# Patient Record
Sex: Female | Born: 1970
Health system: Southern US, Community
[De-identification: ages and names within clinical notes are randomized; demographics above are authoritative.]

## PROBLEM LIST (undated history)

## (undated) DIAGNOSIS — F329 Major depressive disorder, single episode, unspecified: Secondary | ICD-10-CM

## (undated) DIAGNOSIS — R5383 Other fatigue: Secondary | ICD-10-CM

## (undated) DIAGNOSIS — T7840XA Allergy, unspecified, initial encounter: Secondary | ICD-10-CM

## (undated) DIAGNOSIS — M545 Low back pain: Secondary | ICD-10-CM

## (undated) DIAGNOSIS — F32A Depression, unspecified: Secondary | ICD-10-CM

## (undated) DIAGNOSIS — F419 Anxiety disorder, unspecified: Secondary | ICD-10-CM

## (undated) DIAGNOSIS — G4700 Insomnia, unspecified: Secondary | ICD-10-CM

## (undated) DIAGNOSIS — E8881 Metabolic syndrome: Secondary | ICD-10-CM

## (undated) DIAGNOSIS — F411 Generalized anxiety disorder: Secondary | ICD-10-CM

## (undated) HISTORY — DX: Generalized anxiety disorder: F41.1

## (undated) HISTORY — DX: Morbid (severe) obesity due to excess calories: E66.01

## (undated) HISTORY — PX: BACK SURGERY: SHX140

## (undated) HISTORY — DX: Allergy, unspecified, initial encounter: T78.40XA

## (undated) HISTORY — DX: Depression, unspecified: F32.A

## (undated) HISTORY — DX: Other fatigue: R53.83

## (undated) HISTORY — DX: Major depressive disorder, single episode, unspecified: F32.9

## (undated) HISTORY — DX: Insomnia, unspecified: G47.00

## (undated) HISTORY — DX: Low back pain: M54.5

## (undated) HISTORY — PX: PILONIDAL CYST EXCISION: SHX744

## (undated) HISTORY — DX: Metabolic syndrome: E88.81

## (undated) HISTORY — PX: OVARIAN CYST REMOVAL: SHX89

## (undated) HISTORY — DX: Anxiety disorder, unspecified: F41.9

---

## 2000-05-19 ENCOUNTER — Other Ambulatory Visit: Admission: RE | Admit: 2000-05-19 | Discharge: 2000-05-19 | Payer: Self-pay | Admitting: *Deleted

## 2001-06-16 ENCOUNTER — Other Ambulatory Visit: Admission: RE | Admit: 2001-06-16 | Discharge: 2001-06-16 | Payer: Self-pay | Admitting: Gynecology

## 2002-06-22 ENCOUNTER — Other Ambulatory Visit: Admission: RE | Admit: 2002-06-22 | Discharge: 2002-06-22 | Payer: Self-pay | Admitting: Gynecology

## 2002-12-20 ENCOUNTER — Other Ambulatory Visit: Admission: RE | Admit: 2002-12-20 | Discharge: 2002-12-20 | Payer: Self-pay | Admitting: *Deleted

## 2003-07-20 ENCOUNTER — Other Ambulatory Visit: Admission: RE | Admit: 2003-07-20 | Discharge: 2003-07-20 | Payer: Self-pay | Admitting: *Deleted

## 2003-09-28 ENCOUNTER — Emergency Department (HOSPITAL_COMMUNITY): Admission: EM | Admit: 2003-09-28 | Discharge: 2003-09-28 | Payer: Self-pay

## 2003-12-21 ENCOUNTER — Other Ambulatory Visit: Admission: RE | Admit: 2003-12-21 | Discharge: 2003-12-21 | Payer: Self-pay | Admitting: Obstetrics and Gynecology

## 2004-10-29 ENCOUNTER — Encounter: Admission: RE | Admit: 2004-10-29 | Discharge: 2004-11-23 | Payer: Self-pay | Admitting: Family Medicine

## 2004-11-26 ENCOUNTER — Ambulatory Visit (HOSPITAL_COMMUNITY): Admission: RE | Admit: 2004-11-26 | Discharge: 2004-11-27 | Payer: Self-pay | Admitting: Neurosurgery

## 2005-01-30 ENCOUNTER — Ambulatory Visit (HOSPITAL_COMMUNITY): Admission: RE | Admit: 2005-01-30 | Discharge: 2005-01-31 | Payer: Self-pay | Admitting: Neurosurgery

## 2005-06-25 ENCOUNTER — Other Ambulatory Visit: Admission: RE | Admit: 2005-06-25 | Discharge: 2005-06-25 | Payer: Self-pay | Admitting: Obstetrics and Gynecology

## 2008-12-29 ENCOUNTER — Encounter: Admission: RE | Admit: 2008-12-29 | Discharge: 2008-12-29 | Payer: Self-pay | Admitting: Family Medicine

## 2010-10-23 DIAGNOSIS — M545 Low back pain, unspecified: Secondary | ICD-10-CM | POA: Insufficient documentation

## 2010-10-23 DIAGNOSIS — J309 Allergic rhinitis, unspecified: Secondary | ICD-10-CM | POA: Insufficient documentation

## 2010-10-23 HISTORY — DX: Low back pain, unspecified: M54.50

## 2010-10-25 DIAGNOSIS — F411 Generalized anxiety disorder: Secondary | ICD-10-CM | POA: Insufficient documentation

## 2010-10-25 HISTORY — DX: Generalized anxiety disorder: F41.1

## 2010-11-23 NOTE — Op Note (Signed)
Victoria Cisneros, Victoria Cisneros               ACCOUNT NO.:  [id number]   MEDICAL RECORD NO.:  [id number]          PATIENT TYPE:  OIB   LOCATION:  2899                         FACILITY:  [facility]   PHYSICIAN:  Victoria Cisneros, M.D.DATE OF BIRTH:  February 08, 1971   DATE OF PROCEDURE:  11/26/2004  DATE OF DISCHARGE:                                 OPERATIVE REPORT   PREOPERATIVE DIAGNOSES:  Left L4-L5 disk herniation, lumbar degenerative  disk disease, lumbar spondylosis, lumbar radiculopathy.   POSTOPERATIVE DIAGNOSES:  Left L4-L5 disk herniation, lumbar degenerative  disk disease, lumbar spondylosis, lumbar radiculopathy.   PROCEDURE:  Left L4-L5 lumbar laminotomy, microdiskectomy with  microdissection.   SURGEON:  Victoria Cisneros, M.D.   ASSISTANT:  Victoria Cisneros. Victoria Cisneros, M.D.   ANESTHESIA:  General endotracheal.   INDICATIONS:  The patient is a 40 year old woman who presented with left  lumbar radiculopathy and was found to have significant degenerative changes  with superimposed disk herniations to the right at L3-L4 and to the left at  L4-L5.  Because her symptoms were to the left side, we felt that the L4-L5  level was symptomatic and decided to proceed with elective laminotomy and  microdiskectomy.   DESCRIPTION OF PROCEDURE:  The patient was brought to the operating room and  placed under general endotracheal anesthesia.  The patient was turned to a  prone position.  The lumbar region was prepped with Betadine soap and  solution and draped in a sterile fashion.  The midline was infiltrated with  local anesthetic with epinephrine.  An x-ray was taken and the L4-L5 level  identified.  A midline longitudinal was made over the L4-L5 level and  carried down through the subcutaneous tissue.  Bipolar cautery and  electrocautery were used to maintain hemostasis. Dissection was carried down  to the lumbar fascia which was incised on the left side of the midline, and  the paraspinal muscles were  dissected down to the spinous process and lamina  in a subperiosteal fashion.  The L4-L5 interlaminar space was identified.  An x-ray was taken to confirm the localization, and then a laminotomy was  performed using the X-Max drill and Kerrison punches.  The microscope was  draped and brought into the field and provided magnification, illumination,  and visualization, and the remainder of the decompression was performed  using the microdissection and microsurgical technique.  We carefully  resected the ligamentum flavum and expose the thecal sac and exiting left L5  nerve root.  These structures were gently retracted medially exposing the  left L4-L5 disk herniation with the fragment protruding through the annulus  compressing the thecal sac and nerve root and extending somewhat caudally  behind the body of L5.  This fragment was removed, and then we entered into  the disk space and proceeded with a thorough diskectomy removing all loose  fragment of disk material from both the disk space and the epidural space  and achieving good decompression of the thecal sac and nerve root.  Once the  diskectomy was completed and all loose fragments of disk material were  removed, we established  hemostasis with the use of bipolar cautery, and then  we irrigated the wound successfully with bacitracin solution, checked for  hemostasis once again, and then instilled 2 cc of fentanyl and 80 mg of Depo-  Medrol into the epidural space and proceeded with closure.  The deep fascia  was closed with interrupted undyed 1 Vicryl suture.  The deep subcutaneous  layer was closed with interrupted inverted undyed 1 Vicryl suture.  The  subcutaneous and subcuticular were closed with interrupted inverted 2-0  undyed Vicryl sutures, and the skin was reapproximated with Dermabond.  The  procedure was tolerated well.  The estimated blood loss was less than 25 cc.  Sponge and needle count were correct.  Following surgery,  the wound was  dressed with Adaptic and sterile gauze.  The procedure was tolerated well.  Following surgery, she was to be turned back to the supine position, to be  reversed from anesthetic, extubated, and transferred to the recovery room  for further care.      RWN/MEDQ  D:  11/26/2004  T:  11/26/2004  Job:  [id number]

## 2010-11-23 NOTE — Op Note (Signed)
NAMECHERYN, Victoria Cisneros               ACCOUNT NO.:  [id number]   MEDICAL RECORD NO.:  [id number]          PATIENT TYPE:  INP   LOCATION:  2899                         FACILITY:  [facility]   PHYSICIAN:  Hewitt Shorts, M.D.DATE OF BIRTH:  February 11, 1971   DATE OF PROCEDURE:  01/30/2005  DATE OF DISCHARGE:                                 OPERATIVE REPORT   PREOP DIAGNOSIS:  Recurrent left L4-5 lumbar disk herniation, lumbar  degenerative disease, lumbar spondylosis and lumbar radiculopathy.   POSTOPERATIVE DIAGNOSIS:  Recurrent left L4-5 lumbar disk herniation, lumbar  degenerative disease, lumbar spondylosis and lumbar radiculopathy.   PROCEDURE:  Left L4-5 lumbar laminotomy, microdiskectomy with  microdissection.   SURGEON:  Hewitt Shorts, M.D.   ASSISTANT.:  Danae Orleans. Venetia Maxon, M.D.   ANESTHESIA:  General endotracheal.   INDICATIONS FOR PROCEDURE:  The patient is a 40 year old woman who was about  2-1/2 months status post a left L4-5 lumbar laminotomy and microdiskectomy.  She did well but developed recurrent radicular pain. MRI scan revealed a  large recurrent disk herniation.  A decision was made to proceed with  elective laminotomy, microdiskectomy.   PROCEDURE:  The patient was brought to the operating room and placed under  general endotracheal anesthesia.  The patient was turned to prone position,  lumbar region was prepped with Betadine soap solution, draped in sterile  fashion. The previous midline incision was reopened after an x-ray taken to  confirm localization. Dissection was carried down through subcutaneous  tissue. Bipolar cautery and electrocautery used to maintain hemostasis.  Dissection was carried down to the lumbar fascia which was incised on the  left side of the midline. The paraspinal muscles were resected from the  spinous process and lamina in subperiosteal fashion. The previous laminotomy  was identified and x-rays taken to confirm the localization and  using  curettes, we were able to remove the scar tissue. The microscope was draped  and brought the field to provide additional magnification, illumination and  visualization and remainder of the decompression was performed using  microdissection and microsurgical technique.  We dissected in the epidural  space and encountered the disk herniation. We identified the disk space and  gently retracted the thecal sac and nerve root medially.  We were able to  more fully mobilize the herniated fragment and were able to remove a large  recurrent disk fragment that had herniated free within the epidural space.  The thecal sac and nerve root were decompressed. We entered into the disk  space and using a variety of pituitary rongeurs, completed the diskectomy  removing all loose fragments of disk material from both the disk space and  epidural space.  The wound was irrigated bacitracin solution, checked for  hemostasis which was established with use of bipolar cautery as well as  Gelfoam soaked in Thrombin. We then instilled to 2 cc of fentanyl and 80  milligrams Depo-Medrol into the epidural space and proceeded with closure.  The deep fascia closed with interrupted, undyed one Vicryl sutures. The deep  subcutaneous layer was closed interrupted undyed one Vicryl sutures. The  subcutaneous and subcuticular layer closed with interrupted inverted 2-0 and  3-0 Vicryl sutures and skin edges were approximated with Dermabond. The  wound was dressed with Adaptic and sterile gauze.  The procedure was  tolerated well.  The estimated blood loss was less than 25 cc. Sponge,  needle count were correct. Following surgery the patient was turned back to  supine position to be reversed anesthetic, extubated and transferred to the  recovery room for further care.       RWN/MEDQ  D:  01/30/2005  T:  01/30/2005  Job:  [id number]

## 2011-07-17 ENCOUNTER — Other Ambulatory Visit: Payer: Self-pay | Admitting: Obstetrics and Gynecology

## 2011-07-17 DIAGNOSIS — N6310 Unspecified lump in the right breast, unspecified quadrant: Secondary | ICD-10-CM

## 2011-07-24 ENCOUNTER — Ambulatory Visit
Admission: RE | Admit: 2011-07-24 | Discharge: 2011-07-24 | Disposition: A | Discharge status: Home / Self Care | Payer: BC Managed Care – PPO | Source: Ambulatory Visit | Attending: Obstetrics and Gynecology | Admitting: Obstetrics and Gynecology

## 2011-07-24 DIAGNOSIS — N6310 Unspecified lump in the right breast, unspecified quadrant: Secondary | ICD-10-CM

## 2012-01-29 DIAGNOSIS — F430 Acute stress reaction: Secondary | ICD-10-CM | POA: Insufficient documentation

## 2012-01-29 DIAGNOSIS — F329 Major depressive disorder, single episode, unspecified: Secondary | ICD-10-CM | POA: Insufficient documentation

## 2012-01-29 DIAGNOSIS — F410 Panic disorder [episodic paroxysmal anxiety] without agoraphobia: Secondary | ICD-10-CM | POA: Insufficient documentation

## 2012-01-29 DIAGNOSIS — R5383 Other fatigue: Secondary | ICD-10-CM | POA: Insufficient documentation

## 2012-01-29 DIAGNOSIS — F32A Depression, unspecified: Secondary | ICD-10-CM | POA: Insufficient documentation

## 2012-01-29 DIAGNOSIS — G4700 Insomnia, unspecified: Secondary | ICD-10-CM | POA: Insufficient documentation

## 2012-01-29 HISTORY — DX: Depression, unspecified: F32.A

## 2012-01-29 HISTORY — DX: Insomnia, unspecified: G47.00

## 2012-01-29 HISTORY — DX: Other fatigue: R53.83

## 2012-02-04 DIAGNOSIS — E559 Vitamin D deficiency, unspecified: Secondary | ICD-10-CM | POA: Insufficient documentation

## 2012-10-31 ENCOUNTER — Emergency Department (HOSPITAL_BASED_OUTPATIENT_CLINIC_OR_DEPARTMENT_OTHER): Payer: No Typology Code available for payment source

## 2012-10-31 ENCOUNTER — Encounter (HOSPITAL_BASED_OUTPATIENT_CLINIC_OR_DEPARTMENT_OTHER): Payer: Self-pay | Admitting: *Deleted

## 2012-10-31 DIAGNOSIS — Y929 Unspecified place or not applicable: Secondary | ICD-10-CM | POA: Insufficient documentation

## 2012-10-31 DIAGNOSIS — S838X9A Sprain of other specified parts of unspecified knee, initial encounter: Secondary | ICD-10-CM | POA: Insufficient documentation

## 2012-10-31 DIAGNOSIS — IMO0001 Reserved for inherently not codable concepts without codable children: Secondary | ICD-10-CM | POA: Insufficient documentation

## 2012-10-31 DIAGNOSIS — X500XXA Overexertion from strenuous movement or load, initial encounter: Secondary | ICD-10-CM | POA: Insufficient documentation

## 2012-10-31 DIAGNOSIS — Y9302 Activity, running: Secondary | ICD-10-CM | POA: Insufficient documentation

## 2012-10-31 DIAGNOSIS — Y9364 Activity, baseball: Secondary | ICD-10-CM | POA: Insufficient documentation

## 2012-10-31 DIAGNOSIS — S86819A Strain of other muscle(s) and tendon(s) at lower leg level, unspecified leg, initial encounter: Secondary | ICD-10-CM | POA: Insufficient documentation

## 2012-10-31 NOTE — ED Notes (Signed)
Pt states she was playing softball earlier this p.m. And stepped in a hole. Heard something pop. Unable to bear wt. PMS intact. C/O pain to calf area.

## 2012-11-01 ENCOUNTER — Emergency Department (HOSPITAL_BASED_OUTPATIENT_CLINIC_OR_DEPARTMENT_OTHER)
Admission: EM | Admit: 2012-11-01 | Discharge: 2012-11-01 | Disposition: A | Discharge status: Home / Self Care | Payer: No Typology Code available for payment source | Attending: Emergency Medicine | Admitting: Emergency Medicine

## 2012-11-01 DIAGNOSIS — S86112A Strain of other muscle(s) and tendon(s) of posterior muscle group at lower leg level, left leg, initial encounter: Secondary | ICD-10-CM

## 2012-11-01 MED ORDER — IBUPROFEN 800 MG PO TABS: 800.0000 mg | ORAL_TABLET | Freq: Three times a day (TID) | ORAL | Status: DC

## 2012-11-01 MED ORDER — OXYCODONE-ACETAMINOPHEN 5-325 MG PO TABS
2.0000 | ORAL_TABLET | Freq: Once | ORAL | Status: AC
  Administered 2012-11-01: 2 via ORAL
  Filled 2012-11-01 (×2): qty 2

## 2012-11-01 MED ORDER — OXYCODONE-ACETAMINOPHEN 5-325 MG PO TABS: 2.0000 | ORAL_TABLET | ORAL | Status: DC | PRN

## 2012-11-01 NOTE — ED Provider Notes (Signed)
History     CSN: [id number]  Arrival date & time 10/31/12  2125   First MD Initiated Contact with Patient 11/01/12 0015      Chief Complaint  Patient presents with  . Leg Injury    (Consider location/radiation/quality/duration/timing/severity/associated sxs/prior treatment) HPI Comments: Patient was playing softball 6 hours ago when running and feels that she stepped on a rut. She had immediate pain in the back of her left calf and heard a pop. She's been unable to bear weight since. She denies falling. She denies any weakness, numbness or tingling. She has no open wounds. She has no pain with range of motion of her knee or ankle.  The history is provided by the patient.    History reviewed. No pertinent past medical history.  Past Surgical History  Procedure Laterality Date  . Back surgery      History reviewed. No pertinent family history.  History  Substance Use Topics  . Smoking status: Never Smoker   . Smokeless tobacco: Not on file  . Alcohol Use: No    OB History   Grav Para Term Preterm Abortions TAB SAB Ect Mult Living                  Review of Systems  Constitutional: Negative for activity change and appetite change.  HENT: Negative for congestion and rhinorrhea.   Respiratory: Negative for cough, chest tightness and shortness of breath.   Cardiovascular: Negative for chest pain.  Gastrointestinal: Negative for nausea, vomiting and abdominal pain.  Genitourinary: Negative for dysuria and hematuria.  Musculoskeletal: Positive for myalgias and arthralgias.  Skin: Negative for rash.  Neurological: Negative for dizziness, weakness and headaches.  A complete 10 system review of systems was obtained and all systems are negative except as noted in the HPI and PMH.    Allergies  Sulfa antibiotics  Home Medications  No current outpatient prescriptions on file.  BP 134/86  Pulse 104  Temp(Src) 98.4 F (36.9 C) (Oral)  Resp 20  Ht 5\' 7"  (1.702 m)  Wt  247 lb (112.038 kg)  BMI 38.68 kg/m2  SpO2 97%  LMP 10/05/2012  Physical Exam  Constitutional: She is oriented to person, place, and time. She appears well-developed and well-nourished. No distress.  HENT:  Head: Normocephalic and atraumatic.  Mouth/Throat: Oropharynx is clear and moist. No oropharyngeal exudate.  Eyes: Conjunctivae and EOM are normal. Pupils are equal, round, and reactive to light.  Neck: Normal range of motion. Neck supple.  Cardiovascular: Normal rate, regular rhythm and normal heart sounds.   No murmur heard. Pulmonary/Chest: Effort normal and breath sounds normal. No respiratory distress.  Abdominal: Soft. There is no tenderness. There is no rebound and no guarding.  Musculoskeletal: Normal range of motion. She exhibits tenderness. She exhibits no edema.  Left calf tenderness to palpation on medial gastroc head. Full range of motion of left knee and ankle. Intact DP and PT pulses.  Achilles tendon intact with intact ankle plantar and dorsiflexion  Neurological: She is alert and oriented to person, place, and time. No cranial nerve deficit. She exhibits normal muscle tone. Coordination normal.  Skin: Skin is warm.    ED Course  Procedures (including critical care time)  Labs Reviewed - No data to display Dg Tibia/fibula Left  10/31/2012  *RADIOLOGY REPORT*  Clinical Data: Pain  LEFT TIBIA AND FIBULA - 2 VIEW  Comparison: None.  Findings: No fracture or dislocation.  No soft tissue abnormality. No radiopaque foreign body.  IMPRESSION: No fracture of the left tibia or fibula identified.   Original Report Authenticated By: Christiana Pellant, M.D.      No diagnosis found.    MDM  Left calf pain onset while running. No weakness, numbness, tingling.  Intact DP and PT pulses.  Achilles tendon intact. Suspect gastroc strain or tear.  Analgesics, cam walker, crutches, ortho followup.        Glynn Octave, MD 11/01/12 0200

## 2012-11-01 NOTE — ED Notes (Signed)
rx x 2 for ibuprofen and percocet- pt has a ride

## 2012-11-03 ENCOUNTER — Ambulatory Visit (INDEPENDENT_AMBULATORY_CARE_PROVIDER_SITE_OTHER): Payer: No Typology Code available for payment source | Admitting: Family Medicine

## 2012-11-03 ENCOUNTER — Encounter: Payer: Self-pay | Admitting: Family Medicine

## 2012-11-03 VITALS — BP 119/81 | HR 91 | Ht 67.0 in | Wt 247.0 lb

## 2012-11-03 DIAGNOSIS — S838X9A Sprain of other specified parts of unspecified knee, initial encounter: Secondary | ICD-10-CM

## 2012-11-03 DIAGNOSIS — S86112A Strain of other muscle(s) and tendon(s) of posterior muscle group at lower leg level, left leg, initial encounter: Secondary | ICD-10-CM

## 2012-11-03 NOTE — Patient Instructions (Addendum)
You have a calf strain/partial tear (tennis leg) Compression sleeve or ace wrap to help with swelling and pain. Icing for 15 minutes at a time 3-4 times a day Heel lifts in cam walker for next 1-2 weeks - can switch to heel lift in a supportive shoe when you feel comfortable. Crutches as needed Tylenol and/or ibuprofen for pain. Start ankle range of motion exercises (up/down and alphabet exercises) 2-3 times a day. Follow up with me in 2 weeks for reevaluation.

## 2012-11-04 ENCOUNTER — Encounter: Payer: Self-pay | Admitting: Family Medicine

## 2012-11-04 DIAGNOSIS — S86119A Strain of other muscle(s) and tendon(s) of posterior muscle group at lower leg level, unspecified leg, initial encounter: Secondary | ICD-10-CM | POA: Insufficient documentation

## 2012-11-04 NOTE — Assessment & Plan Note (Signed)
start with ACE wrap, icing, heel lifts in cam walker.  Start ROM exercises multiple times a day.  Continue using crutches but weight bear as tolerated.  Tylenol, nsaids as needed.  F/u in 2 weeks for reevaluation.

## 2012-11-04 NOTE — Progress Notes (Signed)
  Subjective:    Patient ID: Victoria Cisneros, female    DOB: 1971/06/24, 42 y.o.   MRN: [id number]  PCP: Dr. Asencion Partridge  HPI 42 yo F here for left calf injury.  Patient reports she was playing in a church softball league on 4/26. Was running between first and second base when she felt a pop in left calf medially. Some swelling but no bruising. Went to ED as she couldn't put weight on leg. Was placed in a cam walker with crutches and referred to [place]. Taking ibuprofen. No prior calf injuries.  History reviewed. No pertinent past medical history.  Current Outpatient Prescriptions on File Prior to Visit  Medication Sig Dispense Refill  . ibuprofen (ADVIL,MOTRIN) 800 MG tablet Take 1 tablet (800 mg total) by mouth 3 (three) times daily.  21 tablet  0  . oxyCODONE-acetaminophen (PERCOCET/ROXICET) 5-325 MG per tablet Take 2 tablets by mouth every 4 (four) hours as needed for pain.  15 tablet  0   No current facility-administered medications on file prior to visit.    Past Surgical History  Procedure Laterality Date  . Back surgery      Allergies  Allergen Reactions  . Sulfa Antibiotics Anaphylaxis    History   Social History  . Marital Status: Married    Spouse Name: N/A    Number of Children: N/A  . Years of Education: N/A   Occupational History  . Not on file.   Social History Main Topics  . Smoking status: Never Smoker   . Smokeless tobacco: Not on file  . Alcohol Use: No  . Drug Use: No  . Sexually Active: Not Currently    Birth Control/ Protection: None   Other Topics Concern  . Not on file   Social History Narrative  . No narrative on file    Family History  Problem Relation Age of Onset  . Diabetes Mother   . Hyperlipidemia Mother   . Hypertension Mother   . Hypertension Father   . Hyperlipidemia Father   . Diabetes Father   . Heart attack Father   . Sudden death Neg Hx     BP 119/81  Pulse 91  Ht 5\' 7"  (1.702 m)  Wt 247 lb (112.038 kg)  BMI  38.68 kg/m2  LMP 10/05/2012  Review of Systems See HPI above.    Objective:   Physical Exam Gen: NAD  L leg: No obvious deformity, bruising.  Slight swelling medial gastroc area. TTP medial gastroc reproducing her pain - no obvious defect in muscle.  No tenderness of achilles, elsewhere about lower leg. FROM ankle but pain on plantar and dorsiflexion. Able to resist all ankle motions. NVI distally.    Assessment & Plan:  1. Left calf strain - start with ACE wrap, icing, heel lifts in cam walker.  Start ROM exercises multiple times a day.  Continue using crutches but weight bear as tolerated.  Tylenol, nsaids as needed.  F/u in 2 weeks for reevaluation.

## 2012-11-17 ENCOUNTER — Ambulatory Visit (INDEPENDENT_AMBULATORY_CARE_PROVIDER_SITE_OTHER): Payer: No Typology Code available for payment source | Admitting: Family Medicine

## 2012-11-17 ENCOUNTER — Encounter: Payer: Self-pay | Admitting: Family Medicine

## 2012-11-17 VITALS — BP 128/84 | HR 85 | Ht 67.0 in | Wt 247.0 lb

## 2012-11-17 DIAGNOSIS — S86112D Strain of other muscle(s) and tendon(s) of posterior muscle group at lower leg level, left leg, subsequent encounter: Secondary | ICD-10-CM

## 2012-11-17 DIAGNOSIS — Z5189 Encounter for other specified aftercare: Secondary | ICD-10-CM

## 2012-11-17 NOTE — Assessment & Plan Note (Signed)
Left calf strain - much improved clinically.  Switch to cam walker only as needed.  Icing, ace wrap, nsaids as needed only now.  Start theraband strengthening and slowly add two footed calf raise 3 sets of 10 once a day.  When able can do this on one leg.  Expect about 4 weeks to completely improved.  Consider formal PT if she has problems.  Heel lifts in regular shoes now.

## 2012-11-17 NOTE — Patient Instructions (Addendum)
You have a calf strain/partial tear (tennis leg) Can switch to heel lift in your regular shoes if you can tolerate this.  Use cam walker as needed. Consider compression sleeve or ace wrap to help with swelling and pain. Icing for 15 minutes at a time 3-4 times a day as needed. Tylenol and/or ibuprofen for pain. Continue ankle range of motion exercises (up/down and alphabet exercises) 2-3 times a day. Add theraband strengthening (especially the push straight down one) 3 sets of 10 once a day. Over next week do standing calf raises with both feet - work way up to 3 sets of 10 once a day with these. Finally after another 1-2 weeks (when no pain or very little pain) you can do one legged calf raises 3 sets of 10. Do exercises for about 5-6 weeks from today (or until pain is gone and it doesn't feel weak). Follow up with me as needed. Should be another 4 weeks before you feel about 100%

## 2012-11-17 NOTE — Progress Notes (Signed)
Subjective:    Patient ID: Victoria Cisneros, female    DOB: 1970/09/03, 42 y.o.   MRN: [id number]  PCP: Dr. Asencion Partridge  HPI  42 yo F here for f/u left calf injury.  4/29: Patient reports she was playing in a church softball league on 4/26. Was running between first and second base when she felt a pop in left calf medially. Some swelling but no bruising. Went to ED as she couldn't put weight on leg. Was placed in a cam walker with crutches and referred to [place]. Taking ibuprofen. No prior calf injuries.  5/13: Patient reports a lot of improvement since last visit. Able to come out of cam walker at times though gets sore with a lot of walking. Finished using ACE wrap and crutches. Still icing, using tylenol as needed. Doing home ROM exercises. No new complaints.  History reviewed. No pertinent past medical history.  Current Outpatient Prescriptions on File Prior to Visit  Medication Sig Dispense Refill  . ibuprofen (ADVIL,MOTRIN) 800 MG tablet Take 1 tablet (800 mg total) by mouth 3 (three) times daily.  21 tablet  0  . oxyCODONE-acetaminophen (PERCOCET/ROXICET) 5-325 MG per tablet Take 2 tablets by mouth every 4 (four) hours as needed for pain.  15 tablet  0   No current facility-administered medications on file prior to visit.    Past Surgical History  Procedure Laterality Date  . Back surgery      Allergies  Allergen Reactions  . Sulfa Antibiotics Anaphylaxis    History   Social History  . Marital Status: Married    Spouse Name: N/A    Number of Children: N/A  . Years of Education: N/A   Occupational History  . Not on file.   Social History Main Topics  . Smoking status: Never Smoker   . Smokeless tobacco: Not on file  . Alcohol Use: No  . Drug Use: No  . Sexually Active: Not Currently    Birth Control/ Protection: None   Other Topics Concern  . Not on file   Social History Narrative  . No narrative on file    Family History  Problem Relation Age  of Onset  . Diabetes Mother   . Hyperlipidemia Mother   . Hypertension Mother   . Hypertension Father   . Hyperlipidemia Father   . Diabetes Father   . Heart attack Father   . Sudden death Neg Hx     BP 128/84  Pulse 85  Ht 5\' 7"  (1.702 m)  Wt 247 lb (112.038 kg)  BMI 38.68 kg/m2  LMP 10/05/2012  Review of Systems  See HPI above.    Objective:   Physical Exam  Gen: NAD  L leg: No obvious deformity, bruising.  Minimal swelling. Mild TTP medial gastroc reproducing her pain - no obvious defect in muscle.  No tenderness of achilles, elsewhere about lower leg. FROM ankle with minimal pain on plantar and dorsiflexion. Able to resist all ankle motions with 5/5 gross strength.  Pain with resisted plantar flexion. NVI distally.    Assessment & Plan:  1. Left calf strain - much improved clinically.  Switch to cam walker only as needed.  Icing, ace wrap, nsaids as needed only now.  Start theraband strengthening and slowly add two footed calf raise 3 sets of 10 once a day.  When able can do this on one leg.  Expect about 4 weeks to completely improved.  Consider formal PT if she has problems.  Heel  lifts in regular shoes now.

## 2013-08-30 ENCOUNTER — Other Ambulatory Visit (HOSPITAL_COMMUNITY): Payer: Self-pay | Admitting: Obstetrics and Gynecology

## 2013-08-30 DIAGNOSIS — Z3141 Encounter for fertility testing: Secondary | ICD-10-CM

## 2013-09-06 ENCOUNTER — Ambulatory Visit (HOSPITAL_COMMUNITY): Payer: BC Managed Care – PPO

## 2013-11-30 ENCOUNTER — Encounter (HOSPITAL_COMMUNITY): Payer: Self-pay

## 2013-12-02 NOTE — H&P (Signed)
Victoria Cisneros is an 43 year old G -0 with 2.86 cm right ovarian dermoid on ultrasound. She complains of right lower quadrant pain. Pertinent Gynecological History: Menses: flow is light Bleeding: none Contraception: none DES exposure: denies Blood transfusions: none Sexually transmitted diseases: no past history Previous GYN Procedures: none  Last mammogram: normal Date: 2014 Last pap: normal Date: 2014 OB History: G0, P0   Menstrual History: Menarche age: unknown No LMP recorded.    No past medical history on file.  Past Surgical History  Procedure Laterality Date  . Back surgery      Family History  Problem Relation Age of Onset  . Diabetes Mother   . Hyperlipidemia Mother   . Hypertension Mother   . Hypertension Father   . Hyperlipidemia Father   . Diabetes Father   . Heart attack Father   . Sudden death Neg Hx     Social History:  reports that she has never smoked. She does not have any smokeless tobacco history on file. She reports that she does not drink alcohol or use illicit drugs.  Allergies:  Allergies  Allergen Reactions  . Sulfa Antibiotics Anaphylaxis    No prescriptions prior to admission    Review of Systems  All other systems reviewed and are negative.   There were no vitals taken for this visit. Physical Exam  Nursing note and vitals reviewed. Constitutional: She appears well-developed.  HENT:  Head: Normocephalic.  Eyes: Pupils are equal, round, and reactive to light.  Neck: Normal range of motion.  Cardiovascular: Normal rate, regular rhythm and normal heart sounds.   Respiratory: Effort normal.  GI: Soft.  Genitourinary: Vagina normal.    No results found for this or any previous visit (from the past 24 hour(s)).  No results found.  Assessment/Plan: Right ovarian dermoid Laparoscopy with right ovarian cystectomy Risks reviewed  Consent signed  Cyril Mourning 12/02/2013, 2:07 PM

## 2013-12-03 ENCOUNTER — Ambulatory Visit (HOSPITAL_COMMUNITY)
Admission: RE | Admit: 2013-12-03 | Discharge: 2013-12-03 | Disposition: A | Discharge status: Home / Self Care | Payer: BC Managed Care – PPO | Source: Ambulatory Visit | Attending: Obstetrics and Gynecology | Admitting: Obstetrics and Gynecology

## 2013-12-03 ENCOUNTER — Encounter (HOSPITAL_COMMUNITY)
Admission: RE | Disposition: A | Discharge status: Home / Self Care | Payer: Self-pay | Source: Ambulatory Visit | Attending: Obstetrics and Gynecology

## 2013-12-03 ENCOUNTER — Ambulatory Visit (HOSPITAL_COMMUNITY): Payer: BC Managed Care – PPO | Admitting: Anesthesiology

## 2013-12-03 ENCOUNTER — Encounter (HOSPITAL_COMMUNITY): Payer: BC Managed Care – PPO | Admitting: Anesthesiology

## 2013-12-03 DIAGNOSIS — S86119A Strain of other muscle(s) and tendon(s) of posterior muscle group at lower leg level, unspecified leg, initial encounter: Secondary | ICD-10-CM

## 2013-12-03 DIAGNOSIS — D279 Benign neoplasm of unspecified ovary: Secondary | ICD-10-CM | POA: Insufficient documentation

## 2013-12-03 DIAGNOSIS — K219 Gastro-esophageal reflux disease without esophagitis: Secondary | ICD-10-CM | POA: Insufficient documentation

## 2013-12-03 HISTORY — PX: LAPAROSCOPY: SHX197

## 2013-12-03 LAB — CBC
HCT: 38.3 % (ref 36.0–46.0)
Hemoglobin: 13.4 g/dL (ref 12.0–15.0)
MCH: 30.5 pg (ref 26.0–34.0)
MCHC: 35 g/dL (ref 30.0–36.0)
MCV: 87 fL (ref 78.0–100.0)
PLATELETS: 233 10*3/uL (ref 150–400)
RBC: 4.4 MIL/uL (ref 3.87–5.11)
RDW: 12.8 % (ref 11.5–15.5)
WBC: 9.3 10*3/uL (ref 4.0–10.5)

## 2013-12-03 LAB — PREGNANCY, URINE: PREG TEST UR: NEGATIVE

## 2013-12-03 SURGERY — LAPAROSCOPY OPERATIVE
Anesthesia: General | Site: Abdomen | Laterality: Right

## 2013-12-03 MED ORDER — METOCLOPRAMIDE HCL 5 MG/ML IJ SOLN
10.0000 mg | Freq: Once | INTRAMUSCULAR | Status: AC | PRN
  Administered 2013-12-03: 10 mg via INTRAVENOUS

## 2013-12-03 MED ORDER — ROCURONIUM BROMIDE 100 MG/10ML IV SOLN
INTRAVENOUS | Status: DC | PRN
  Administered 2013-12-03: 40 mg via INTRAVENOUS

## 2013-12-03 MED ORDER — FENTANYL CITRATE 0.05 MG/ML IJ SOLN: 25.0000 ug | INTRAMUSCULAR | Status: DC | PRN

## 2013-12-03 MED ORDER — KETOROLAC TROMETHAMINE 30 MG/ML IJ SOLN: 15.0000 mg | Freq: Once | INTRAMUSCULAR | Status: DC | PRN

## 2013-12-03 MED ORDER — IBUPROFEN 200 MG PO TABS: 600.0000 mg | ORAL_TABLET | Freq: Four times a day (QID) | ORAL | Status: DC | PRN

## 2013-12-03 MED ORDER — LACTATED RINGERS IV SOLN: INTRAVENOUS | Status: DC

## 2013-12-03 MED ORDER — FENTANYL CITRATE 0.05 MG/ML IJ SOLN
INTRAMUSCULAR | Status: AC
  Filled 2013-12-03: qty 5

## 2013-12-03 MED ORDER — GLYCOPYRROLATE 0.2 MG/ML IJ SOLN
INTRAMUSCULAR | Status: DC | PRN
  Administered 2013-12-03: 0.2 mg via INTRAVENOUS
  Administered 2013-12-03: 0.6 mg via INTRAVENOUS

## 2013-12-03 MED ORDER — NEOSTIGMINE METHYLSULFATE 10 MG/10ML IV SOLN
INTRAVENOUS | Status: AC
  Filled 2013-12-03: qty 1

## 2013-12-03 MED ORDER — KETOROLAC TROMETHAMINE 30 MG/ML IJ SOLN
INTRAMUSCULAR | Status: AC
  Filled 2013-12-03: qty 1

## 2013-12-03 MED ORDER — FENTANYL CITRATE 0.05 MG/ML IJ SOLN
INTRAMUSCULAR | Status: AC
  Filled 2013-12-03: qty 2

## 2013-12-03 MED ORDER — DEXAMETHASONE SODIUM PHOSPHATE 10 MG/ML IJ SOLN
INTRAMUSCULAR | Status: AC
  Filled 2013-12-03: qty 1

## 2013-12-03 MED ORDER — DEXAMETHASONE SODIUM PHOSPHATE 10 MG/ML IJ SOLN
INTRAMUSCULAR | Status: DC | PRN
  Administered 2013-12-03: 10 mg via INTRAVENOUS

## 2013-12-03 MED ORDER — CEFAZOLIN SODIUM-DEXTROSE 2-3 GM-% IV SOLR
2.0000 g | INTRAVENOUS | Status: AC
  Administered 2013-12-03: 2 g via INTRAVENOUS

## 2013-12-03 MED ORDER — METOCLOPRAMIDE HCL 5 MG/ML IJ SOLN
INTRAMUSCULAR | Status: AC
  Administered 2013-12-03: 10 mg via INTRAVENOUS
  Filled 2013-12-03: qty 2

## 2013-12-03 MED ORDER — GLYCOPYRROLATE 0.2 MG/ML IJ SOLN
INTRAMUSCULAR | Status: AC
  Filled 2013-12-03: qty 1

## 2013-12-03 MED ORDER — 0.9 % SODIUM CHLORIDE (POUR BTL) OPTIME
TOPICAL | Status: DC | PRN
  Administered 2013-12-03: 1000 mL

## 2013-12-03 MED ORDER — ACETAMINOPHEN 160 MG/5ML PO SOLN
ORAL | Status: AC
  Administered 2013-12-03: 975 mg via ORAL
  Filled 2013-12-03: qty 40.6

## 2013-12-03 MED ORDER — LACTATED RINGERS IR SOLN
Status: DC | PRN
  Administered 2013-12-03: 3000 mL

## 2013-12-03 MED ORDER — FENTANYL CITRATE 0.05 MG/ML IJ SOLN
INTRAMUSCULAR | Status: DC | PRN
  Administered 2013-12-03 (×2): 50 ug via INTRAVENOUS
  Administered 2013-12-03 (×2): 100 ug via INTRAVENOUS
  Administered 2013-12-03: 50 ug via INTRAVENOUS

## 2013-12-03 MED ORDER — NEOSTIGMINE METHYLSULFATE 10 MG/10ML IV SOLN
INTRAVENOUS | Status: DC | PRN
  Administered 2013-12-03: 4 mg via INTRAVENOUS

## 2013-12-03 MED ORDER — MIDAZOLAM HCL 2 MG/2ML IJ SOLN
INTRAMUSCULAR | Status: AC
  Filled 2013-12-03: qty 2

## 2013-12-03 MED ORDER — LACTATED RINGERS IV SOLN
INTRAVENOUS | Status: DC
Start: 2013-12-03 — End: 2013-12-03
  Administered 2013-12-03 (×3): via INTRAVENOUS

## 2013-12-03 MED ORDER — LIDOCAINE HCL (CARDIAC) 20 MG/ML IV SOLN
INTRAVENOUS | Status: AC
  Filled 2013-12-03: qty 5

## 2013-12-03 MED ORDER — ROCURONIUM BROMIDE 100 MG/10ML IV SOLN
INTRAVENOUS | Status: AC
  Filled 2013-12-03: qty 1

## 2013-12-03 MED ORDER — SODIUM CHLORIDE 0.9 % IJ SOLN
INTRAMUSCULAR | Status: AC
  Filled 2013-12-03: qty 10

## 2013-12-03 MED ORDER — ONDANSETRON HCL 4 MG/2ML IJ SOLN
INTRAMUSCULAR | Status: AC
  Filled 2013-12-03: qty 2

## 2013-12-03 MED ORDER — MEPERIDINE HCL 25 MG/ML IJ SOLN: 6.2500 mg | INTRAMUSCULAR | Status: DC | PRN

## 2013-12-03 MED ORDER — ONDANSETRON HCL 4 MG/2ML IJ SOLN
INTRAMUSCULAR | Status: DC | PRN
  Administered 2013-12-03: 4 mg via INTRAVENOUS

## 2013-12-03 MED ORDER — KETOROLAC TROMETHAMINE 30 MG/ML IJ SOLN
INTRAMUSCULAR | Status: DC | PRN
  Administered 2013-12-03: 30 mg via INTRAVENOUS

## 2013-12-03 MED ORDER — BUPIVACAINE HCL (PF) 0.25 % IJ SOLN
INTRAMUSCULAR | Status: DC | PRN
  Administered 2013-12-03: 10 mL

## 2013-12-03 MED ORDER — ACETAMINOPHEN 160 MG/5ML PO SOLN
975.0000 mg | Freq: Once | ORAL | Status: AC
  Administered 2013-12-03: 975 mg via ORAL

## 2013-12-03 MED ORDER — PROPOFOL 10 MG/ML IV EMUL
INTRAVENOUS | Status: AC
  Filled 2013-12-03: qty 20

## 2013-12-03 MED ORDER — GLYCOPYRROLATE 0.2 MG/ML IJ SOLN
INTRAMUSCULAR | Status: AC
  Filled 2013-12-03: qty 3

## 2013-12-03 MED ORDER — BUPIVACAINE HCL (PF) 0.25 % IJ SOLN
INTRAMUSCULAR | Status: AC
  Filled 2013-12-03: qty 30

## 2013-12-03 MED ORDER — MIDAZOLAM HCL 2 MG/2ML IJ SOLN
INTRAMUSCULAR | Status: DC | PRN
  Administered 2013-12-03: 2 mg via INTRAVENOUS

## 2013-12-03 MED ORDER — PANTOPRAZOLE SODIUM 40 MG PO TBEC
40.0000 mg | DELAYED_RELEASE_TABLET | Freq: Once | ORAL | Status: AC | PRN
  Administered 2013-12-03: 40 mg via ORAL

## 2013-12-03 MED ORDER — PANTOPRAZOLE SODIUM 40 MG PO TBEC
DELAYED_RELEASE_TABLET | ORAL | Status: AC
  Filled 2013-12-03: qty 1

## 2013-12-03 MED ORDER — LIDOCAINE HCL (CARDIAC) 20 MG/ML IV SOLN
INTRAVENOUS | Status: DC | PRN
  Administered 2013-12-03: 100 mg via INTRAVENOUS

## 2013-12-03 MED ORDER — PROPOFOL 10 MG/ML IV BOLUS
INTRAVENOUS | Status: DC | PRN
  Administered 2013-12-03: 150 mg via INTRAVENOUS

## 2013-12-03 SURGICAL SUPPLY — 28 items
CABLE HIGH FREQUENCY MONO STRZ (ELECTRODE) ×2 IMPLANT
CATH ROBINSON RED A/P 16FR (CATHETERS) ×2 IMPLANT
CHLORAPREP W/TINT 26ML (MISCELLANEOUS) ×2 IMPLANT
CLOTH BEACON ORANGE TIMEOUT ST (SAFETY) ×2 IMPLANT
DERMABOND ADHESIVE PROPEN (GAUZE/BANDAGES/DRESSINGS) ×1
DERMABOND ADVANCED .7 DNX6 (GAUZE/BANDAGES/DRESSINGS) ×1 IMPLANT
EVACUATOR SMOKE 8.L (FILTER) IMPLANT
GLOVE BIO SURGEON STRL SZ 6.5 (GLOVE) ×2 IMPLANT
GOWN STRL REUS W/TWL LRG LVL3 (GOWN DISPOSABLE) ×4 IMPLANT
NEEDLE INSUFFLATION 120MM (ENDOMECHANICALS) ×2 IMPLANT
NEEDLE INSUFFLATION 150MM (ENDOMECHANICALS) ×2 IMPLANT
NS IRRIG 1000ML POUR BTL (IV SOLUTION) ×2 IMPLANT
PACK LAPAROSCOPY BASIN (CUSTOM PROCEDURE TRAY) ×2 IMPLANT
POUCH SPECIMEN RETRIEVAL 10MM (ENDOMECHANICALS) ×4 IMPLANT
PROTECTOR NERVE ULNAR (MISCELLANEOUS) ×2 IMPLANT
SCISSORS LAP 5X45 EPIX DISP (ENDOMECHANICALS) ×2 IMPLANT
SEALER TISSUE G2 CVD JAW 35 (ENDOMECHANICALS) IMPLANT
SEALER TISSUE G2 CVD JAW 45CM (ENDOMECHANICALS) IMPLANT
SET IRRIG TUBING LAPAROSCOPIC (IRRIGATION / IRRIGATOR) ×2 IMPLANT
SUT VIC AB 3-0 PS2 18 (SUTURE) ×1
SUT VIC AB 3-0 PS2 18XBRD (SUTURE) ×1 IMPLANT
SUT VICRYL 0 UR6 27IN ABS (SUTURE) ×4 IMPLANT
TOWEL OR 17X24 6PK STRL BLUE (TOWEL DISPOSABLE) ×4 IMPLANT
TROCAR BALLN 12MMX100 BLUNT (TROCAR) ×2 IMPLANT
TROCAR OPTI TIP 5M 100M (ENDOMECHANICALS) IMPLANT
TROCAR XCEL DIL TIP R 11M (ENDOMECHANICALS) ×2 IMPLANT
WARMER LAPAROSCOPE (MISCELLANEOUS) ×2 IMPLANT
WATER STERILE IRR 1000ML POUR (IV SOLUTION) ×2 IMPLANT

## 2013-12-03 NOTE — Progress Notes (Signed)
H and p on the chart. No significant changes Will proceed with [facility] and right cystectomy Consent signed

## 2013-12-03 NOTE — Transfer of Care (Signed)
Immediate Anesthesia Transfer of Care Note  Patient: Victoria Cisneros  Procedure(s) Performed: Procedure(s): DIAGNOSTIC LAPAROSCOPY OPERATIVE WITH RIGHT OVARIAN CYSTECTOMY (Right)  Patient Location: PACU  Anesthesia Type:General  Level of Consciousness: awake and sedated  Airway & Oxygen Therapy: Patient Spontanous Breathing and Patient connected to nasal cannula oxygen  Post-op Assessment: Report given to PACU RN and Post -op Vital signs reviewed and stable  Post vital signs: Reviewed and stable  Complications: No apparent anesthesia complications

## 2013-12-03 NOTE — Anesthesia Procedure Notes (Signed)
Procedure Name: Intubation Date/Time: 12/03/2013 7:41 AM Performed by: Yassmine Tamm, Sheron Nightingale Pre-anesthesia Checklist: Patient identified, Timeout performed, Emergency Drugs available, Suction available and Patient being monitored Patient Re-evaluated:Patient Re-evaluated prior to inductionOxygen Delivery Method: Circle system utilized Preoxygenation: Pre-oxygenation with 100% oxygen Intubation Type: IV induction Ventilation: Two handed mask ventilation required and Oral airway inserted - appropriate to patient size Laryngoscope Size: Mac Grade View: Grade II Tube size: 7.0 mm Number of attempts: 1 Airway Equipment and Method: Patient positioned with wedge pillow Placement Confirmation: ETT inserted through vocal cords under direct vision,  breath sounds checked- equal and bilateral and positive ETCO2 Secured at: 20 cm Dental Injury: Teeth and Oropharynx as per pre-operative assessment

## 2013-12-03 NOTE — Anesthesia Postprocedure Evaluation (Signed)
Anesthesia Post Note  Patient: Victoria Cisneros  Procedure(s) Performed: Procedure(s) (LRB): DIAGNOSTIC LAPAROSCOPY OPERATIVE WITH RIGHT OVARIAN CYSTECTOMY (Right)  Anesthesia type: General  Patient location: PACU  Post pain: Pain level controlled  Post assessment: Post-op Vital signs reviewed  Last Vitals:  Filed Vitals:   12/03/13 1000  BP: 119/63  Pulse: 57  Temp: 36.9 C  Resp: 17    Post vital signs: Reviewed  Level of consciousness: sedated  Complications: No apparent anesthesia complications

## 2013-12-03 NOTE — Op Note (Signed)
Victoria Cisneros, Victoria Cisneros               ACCOUNT NO.:  [id number]  MEDICAL RECORD NO.:  [id number]  LOCATION:  [facility]                          FACILITY:  [facility]  PHYSICIAN:  Sami Froh L. Shiv Shuey, M.D.DATE OF BIRTH:  1970-12-02  DATE OF PROCEDURE:  12/03/2013 DATE OF DISCHARGE:                              OPERATIVE REPORT   PREOPERATIVE DIAGNOSIS:  Right ovarian dermoid.  POSTOPERATIVE DIAGNOSIS:  Right ovarian dermoid.  PROCEDURE:  Diagnostic laparoscopy and right ovarian cystectomy.  SURGEON:  Boyd Litaker L. Helane Rima, M.D.  ANESTHESIA:  General with local.  ESTIMATED BLOOD LOSS:  Minimal.  COMPLICATIONS:  None.  PATHOLOGY:  Right ovarian dermoid sent to Pathology.  DESCRIPTION OF PROCEDURE:  The patient had been taken to the operating room after she was consented about the risk associated with the procedure.  She was intubated.  She was then prepped and draped in the usual sterile fashion.  An uterine manipulator was inserted.  The bladder was emptied with a little catheter.  A small infraumbilical incision was made and a Veress needle was attempted to be inserted. However, because of her central obesity, I was not able to insert the Veress needle.  I do not think it was deep enough, so we converted to an open laparoscopy which was easily done.  I grasped the fascia, entered it with Mayo scissors, entered the peritoneum, placed my angle stitches, then inserted the Hasson trocar and then insufflated with CO2.  This was done quite easily.  The laparoscope was then introduced into the peritoneal cavity.  There was no area of bleeding or intestinal injury noted.  The patient was placed in Trendelenburg position.  She did have a lot of adipose tissue, but we could adequately see that her uterus appeared normal.  You could see the enlarged right ovary kind of sitting up on top of the bowel on the right side.  There were no adhesions. Left ovary and left fallopian tubes were normal.  I inserted  a secondary trocar suprapubically under direct visualization.  I used an atraumatic grasper.  We elevated the right ovary using hot scissors.  I made an incision over the dermoid.  The dermoid did drain some. I was able to see the dermoid cyst easily.  I then used million dollar graspers.  I grasped the cyst wall and using kind of a blunt and sharp dissection, I was able to dissect the entire cyst wall free.  There was a little bit of spillage of the dermoid, but we were able to keep it localized to the right side.  I was able to irrigate with a Nezhat irrigator as we proceeded.  After we removed the cyst wall, it was removed easily.  I then did copious irrigation of the ovary and hemostasis was very good. I then irrigated all the remainder of the fluid in the cul-de-sac in the pelvis to make sure there was no remaining fluid left.  At the end of the procedure, the instruments were removed from the abdominal cavity. After the pneumoperitoneum was released, the angle stitches on the fascia were then closed and were tied down in standard fashion at the umbilicus.  The skin was closed with  3-0 Vicryl interrupted.  Dermabond was applied.  All sponge, lap, and instrument counts were correct x2. The patient went to recovery room in stable condition.     Doll Frazee L. Helane Rima, M.D.     Victoria Cisneros  D:  12/03/2013  T:  12/03/2013  Job:  [id number]

## 2013-12-03 NOTE — Brief Op Note (Signed)
12/03/2013  8:52 AM  PATIENT:  Victoria Cisneros  43 y.o. female  PRE-OPERATIVE DIAGNOSIS:  right ovarian dermoid  POST-OPERATIVE DIAGNOSIS:  right ovarian dermoid  PROCEDURE:  Procedure(s): DIAGNOSTIC LAPAROSCOPY OPERATIVE WITH RIGHT OVARIAN CYSTECTOMY (Right)  SURGEON:  Surgeon(s) and Role:    * Cyril Mourning, MD - Primary  PHYSICIAN ASSISTANT:   ASSISTANTS: none   ANESTHESIA:   local and general  EBL:  Total I/O In: 1000 [I.V.:1000] Out: -   BLOOD ADMINISTERED:none  DRAINS: none   LOCAL MEDICATIONS USED:  LIDOCAINE   SPECIMEN:  Source of Specimen:  right ovarian dermoid  DISPOSITION OF SPECIMEN:  PATHOLOGY  COUNTS:  YES  TOURNIQUET:  * No tourniquets in log *  DICTATION: .Other Dictation: Dictation Number [id number]  PLAN OF CARE: Discharge to home after PACU  PATIENT DISPOSITION:  PACU - hemodynamically stable.   Delay start of Pharmacological VTE agent (>24hrs) due to surgical blood loss or risk of bleeding: not applicable

## 2013-12-03 NOTE — Discharge Instructions (Addendum)
Diagnostic Laparoscopy   Laparoscopy is a surgical procedure. It is used to diagnose and treat diseases inside the belly (abdomen). It is usually a brief, common, and relatively simple procedure. The laparoscopeis a thin, lighted, pencil-sized instrument. It is like a telescope. It is inserted into your abdomen through a small cut (incision). Your caregiver can look at the organs inside your body through this instrument. He or she can see if there is anything abnormal.  Laparoscopy can be done either in a hospital or outpatient clinic. You may be given a mild sedative to help you relax before the procedure. Once in the operating room, you will be given a drug to make you sleep (general anesthesia). Laparoscopy usually lasts less than 1 hour. After the procedure, you will be monitored in a recovery area until you are stable and doing well. Once you are home, it will take 2 to 3 days to fully recover.   RISKS AND COMPLICATIONS  Laparoscopy has relatively few risks. Your caregiver will discuss the risks with you before the procedure.  Some problems that can occur include:  Infection.  Bleeding.  Damage to other organs.  Anesthetic side effects.  PROCEDURE  Once you receive anesthesia, your surgeon inflates the abdomen with a harmless gas (carbon dioxide). This makes the organs easier to see. The laparoscope is inserted into the abdomen through a small incision. This allows your surgeon to see into the abdomen. Other small instruments are also inserted into the abdomen through other small openings. Many surgeons attach a video camera to the laparoscope to enlarge the view.  During a diagnostic laparoscopy, the surgeon may be looking for inflammation, infection, or cancer. Your surgeon may take tissue samples(biopsies). The samples are sent to a specialist in looking at cells and tissue samples (pathologist). The pathologist examines them under a microscope. Biopsies can help to diagnose or confirm a  disease.   AFTER THE PROCEDURE  The gas is released from inside the abdomen.  The incisions are closed with stitches (sutures). Because these incisions are small (usually less than 1/2 inch), there is usually minimal discomfort after the procedure. There may be some mild discomfort in the throat. This is from the tube placed in the throat while you were sleeping. You may have some mild abdominal discomfort. There may also be discomfort from the instrument placement incisions in the abdomen.  The recovery time is shortened as long as there are no complications.  You will rest in a recovery room until stable and doing well. As long as there are no complications, you may be allowed to go home.  FINDING OUT THE RESULTS OF YOUR TEST  Not all test results are available during your visit. If your test results are not back during the visit, make an appointment with your caregiver to find out the results. Do not assume everything is normal if you have not heard from your caregiver or the medical facility. It is important for you to follow up on all of your test results.   HOME CARE INSTRUCTIONS  Take all medicines as directed.  Only take over-the-counter or prescription medicines for pain, discomfort, or fever as directed by your caregiver.  Resume daily activities as directed.  Showers are preferred over baths.  You may resume sexual activities in 1 week or as directed.  Do not drive while taking narcotics.  SEEK MEDICAL CARE IF:  There is increasing abdominal pain.  There is new pain in the shoulders (shoulder strap areas).  You feel lightheaded or faint.  You have the chills.  You have an oral temperature above 100.4 F.  There is pus-like (purulent) drainage from any of the wounds.  You are unable to pass gas or have a bowel movement.  You feel sick to your stomach (nauseous) or throw up (vomit).  MAKE SURE YOU:  Understand these instructions.  Will watch your condition.  Will get help  right away if you are not doing well or get worse.

## 2013-12-03 NOTE — Anesthesia Preprocedure Evaluation (Signed)
Anesthesia Evaluation  Patient identified by MRN, date of birth, ID band Patient awake    Reviewed: Allergy & Precautions, H&P , NPO status , Patient's Chart, lab work & pertinent test results, reviewed documented beta blocker date and time   History of Anesthesia Complications Negative for: history of anesthetic complications  Airway Mallampati: II TM Distance: >3 FB Neck ROM: full    Dental  (+) Teeth Intact   Pulmonary neg pulmonary ROS,  breath sounds clear to auscultation  Pulmonary exam normal       Cardiovascular negative cardio ROS  Rhythm:regular Rate:Normal     Neuro/Psych negative neurological ROS  negative psych ROS   GI/Hepatic negative GI ROS, Neg liver ROS, GERD-  Medicated,  Endo/Other  Obese - BMI 38.8  Renal/GU negative Renal ROS  Female GU complaint     Musculoskeletal   Abdominal   Peds  Hematology negative hematology ROS (+)   Anesthesia Other Findings   Reproductive/Obstetrics negative OB ROS                           Anesthesia Physical Anesthesia Plan  ASA: II  Anesthesia Plan: General ETT   Post-op Pain Management:    Induction:   Airway Management Planned:   Additional Equipment:   Intra-op Plan:   Post-operative Plan:   Informed Consent: I have reviewed the patients History and Physical, chart, labs and discussed the procedure including the risks, benefits and alternatives for the proposed anesthesia with the patient or authorized representative who has indicated his/her understanding and acceptance.   Dental Advisory Given  Plan Discussed with: CRNA and Surgeon  Anesthesia Plan Comments:         Anesthesia Quick Evaluation

## 2013-12-06 ENCOUNTER — Encounter (HOSPITAL_COMMUNITY): Payer: Self-pay | Admitting: Obstetrics and Gynecology

## 2014-01-23 ENCOUNTER — Encounter (HOSPITAL_BASED_OUTPATIENT_CLINIC_OR_DEPARTMENT_OTHER): Payer: Self-pay | Admitting: Emergency Medicine

## 2014-01-23 ENCOUNTER — Emergency Department (HOSPITAL_BASED_OUTPATIENT_CLINIC_OR_DEPARTMENT_OTHER): Payer: BC Managed Care – PPO

## 2014-01-23 ENCOUNTER — Emergency Department (HOSPITAL_BASED_OUTPATIENT_CLINIC_OR_DEPARTMENT_OTHER)
Admission: EM | Admit: 2014-01-23 | Discharge: 2014-01-23 | Disposition: A | Discharge status: Home / Self Care | Payer: BC Managed Care – PPO | Attending: Emergency Medicine | Admitting: Emergency Medicine

## 2014-01-23 DIAGNOSIS — IMO0002 Reserved for concepts with insufficient information to code with codable children: Secondary | ICD-10-CM | POA: Insufficient documentation

## 2014-01-23 DIAGNOSIS — Y929 Unspecified place or not applicable: Secondary | ICD-10-CM | POA: Insufficient documentation

## 2014-01-23 DIAGNOSIS — Y9389 Activity, other specified: Secondary | ICD-10-CM | POA: Insufficient documentation

## 2014-01-23 DIAGNOSIS — S8392XA Sprain of unspecified site of left knee, initial encounter: Secondary | ICD-10-CM

## 2014-01-23 DIAGNOSIS — Z79899 Other long term (current) drug therapy: Secondary | ICD-10-CM | POA: Insufficient documentation

## 2014-01-23 DIAGNOSIS — W010XXA Fall on same level from slipping, tripping and stumbling without subsequent striking against object, initial encounter: Secondary | ICD-10-CM | POA: Insufficient documentation

## 2014-01-23 MED ORDER — HYDROCODONE-ACETAMINOPHEN 5-325 MG PO TABS: ORAL_TABLET | ORAL | Status: DC

## 2014-01-23 MED ORDER — HYDROCODONE-ACETAMINOPHEN 5-325 MG PO TABS
1.0000 | ORAL_TABLET | Freq: Once | ORAL | Status: AC
  Administered 2014-01-23: 1 via ORAL
  Filled 2014-01-23: qty 1

## 2014-01-23 NOTE — Discharge Instructions (Signed)
Use crutches and do not bear weight on the left leg for the first 48 hours.  Rest, Ice intermittently (in the first 24-48 hours), Gentle compression with an Ace wrap, and elevate (Limb above the level of the heart)   Take up to 800mg  of ibuprofen (that is usually 4 over the counter pills)  3 times a day for 5 days. Take with food.  Take vicodin for breakthrough pain, do not drink alcohol, drive, care for children or do other critical tasks while taking vicodin.  Please follow with your primary care doctor in the next 2 days for a check-up. They must obtain records for further management.   Do not hesitate to return to the Emergency Department for any new, worsening or concerning symptoms.     Knee Sprain A knee sprain is a tear in one of the strong, fibrous tissues that connect the bones (ligaments) in your knee. The severity of the sprain depends on how much of the ligament is torn. The tear can be either partial or complete. CAUSES  Often, sprains are a result of a fall or injury. The force of the impact causes the fibers of your ligament to stretch too much. This excess tension causes the fibers of your ligament to tear. SIGNS AND SYMPTOMS  You may have some loss of motion in your knee. Other symptoms include:  Bruising.  Pain in the knee area.  Tenderness of the knee to the touch.  Swelling. DIAGNOSIS  To diagnose a knee sprain, your health care provider will physically examine your knee. Your health care provider may also suggest an X-ray exam of your knee to make sure no bones are broken. TREATMENT  If your ligament is only partially torn, treatment usually involves keeping the knee in a fixed position (immobilization) or bracing your knee for activities that require movement for several weeks. To do this, your health care provider will apply a bandage, cast, or splint to keep your knee from moving and to support your knee during movement until it heals. For a partially torn  ligament, the healing process usually takes 4-6 weeks. If your ligament is completely torn, depending on which ligament it is, you may need surgery to reconnect the ligament to the bone or reconstruct it. After surgery, a cast or splint may be applied and will need to stay on your knee for 4-6 weeks while your ligament heals. HOME CARE INSTRUCTIONS  Keep your injured knee elevated to decrease swelling.  To ease pain and swelling, apply ice to the injured area:  Put ice in a plastic bag.  Place a towel between your skin and the bag.  Leave the ice on for 20 minutes, 2-3 times a day.  Only take medicine for pain as directed by your health care provider.  Do not leave your knee unprotected until pain and stiffness go away (usually 4-6 weeks).  If you have a cast or splint, do not allow it to get wet. If you have been instructed not to remove it, cover it with a plastic bag when you shower or bathe. Do not swim.  Your health care provider may suggest exercises for you to do during your recovery to prevent or limit permanent weakness and stiffness. SEEK IMMEDIATE MEDICAL CARE IF:  Your cast or splint becomes damaged.  Your pain becomes worse.  You have significant pain, swelling, or numbness below the cast or splint. MAKE SURE YOU:  Understand these instructions.  Will watch your condition.  Will  get help right away if you are not doing well or get worse. Document Released: 06/24/2005 Document Revised: 04/14/2013 Document Reviewed: 02/03/2013 [facility] Patient Information 2015 [place], [place]. This information is not intended to replace advice given to you by your health care provider. Make sure you discuss any questions you have with your health care provider.

## 2014-01-23 NOTE — ED Notes (Signed)
Pt is here for left knee pain after fall today.

## 2014-01-23 NOTE — ED Provider Notes (Signed)
CSN: [id number]     Arrival date & time 01/23/14  1917 History   First MD Initiated Contact with Patient 01/23/14 1945     Chief Complaint  Patient presents with  . Knee Pain     (Consider location/radiation/quality/duration/timing/severity/associated sxs/prior Treatment) HPI  Victoria Cisneros is a 43 y.o. female complaining of left knee pain status post slip and fall on a wet spot on the wooden floor. Pain is 6/10. Patient is able to ambulate. Pt denies head trauma, LOC, N/V, change in vision, cervicalgia, chest pain, SOB, abdominal pain,  numbness, weakness, difficulty moving major joints, EtOH/illicit drug/perscription drug use that would alter awareness.    History reviewed. No pertinent past medical history. Past Surgical History  Procedure Laterality Date  . Back surgery    . Laparoscopy Right 12/03/2013    Procedure: DIAGNOSTIC LAPAROSCOPY OPERATIVE WITH RIGHT OVARIAN CYSTECTOMY;  Surgeon: Cyril Mourning, MD;  Location: [facility];  Service: Gynecology;  Laterality: Right;  . Ovarian cyst removal     Family History  Problem Relation Age of Onset  . Diabetes Mother   . Hyperlipidemia Mother   . Hypertension Mother   . Hypertension Father   . Hyperlipidemia Father   . Diabetes Father   . Heart attack Father   . Sudden death Neg Hx    History  Substance Use Topics  . Smoking status: Never Smoker   . Smokeless tobacco: Not on file  . Alcohol Use: No   OB History   Grav Para Term Preterm Abortions TAB SAB Ect Mult Living                 Review of Systems  10 systems reviewed and found to be negative, except as noted in the HPI.   Allergies  Sulfa antibiotics  Home Medications   Prior to Admission medications   Medication Sig Start Date End Date Taking? Authorizing Provider  Biotin 5000 MCG CAPS Take 2 capsules by mouth at bedtime.    Historical Provider, MD  HYDROcodone-acetaminophen (NORCO/VICODIN) 5-325 MG per tablet Take 1-2 tablets by mouth every 6 hours as  needed for pain. 01/23/14   Laureen Frederic, PA-C  ibuprofen (ADVIL) 200 MG tablet Take 3 tablets (600 mg total) by mouth every 6 (six) hours as needed for moderate pain. 12/03/13   Cyril Mourning, MD  omeprazole (PRILOSEC) 10 MG capsule Take 10 mg by mouth daily.    Historical Provider, MD  Pediatric Multiple Vit-C-FA (FLINSTONES GUMMIES OMEGA-3 DHA) CHEW Chew 1 tablet by mouth daily.    Historical Provider, MD   BP 131/89  Pulse 95  Temp(Src) 98.4 F (36.9 C) (Oral)  Resp 20  Ht 5' 7.5" (1.715 m)  Wt 247 lb (112.038 kg)  BMI 38.09 kg/m2  SpO2 99%  LMP 01/03/2014 Physical Exam  Nursing note and vitals reviewed. Constitutional: She is oriented to person, place, and time. She appears well-developed and well-nourished. No distress.  HENT:  Head: Normocephalic and atraumatic.  Mouth/Throat: Oropharynx is clear and moist.  Eyes: Conjunctivae and EOM are normal. Pupils are equal, round, and reactive to light.  Neck: Normal range of motion.  No midline C-spine  tenderness to palpation or step-offs appreciated. Patient has full range of motion without pain.   Cardiovascular: Normal rate, regular rhythm and intact distal pulses.   Pulmonary/Chest: Effort normal and breath sounds normal. No stridor. No respiratory distress. She has no wheezes. She has no rales. She exhibits no tenderness.  Abdominal: Soft. Bowel sounds  are normal. She exhibits no distension and no mass. There is no tenderness. There is no rebound and no guarding.  Musculoskeletal: Normal range of motion. She exhibits tenderness.  Left knee with very superficial abrasion. Pain with FROM. Diffusely tender to palpation along the anterior knee No effusion or crepitance. Anterior and posterior drawer show no abnormal laxity. Stable to valgus and varus stress. Joint lines are non-tender. Neurovascularly intact.    Neurological: She is alert and oriented to person, place, and time.  Psychiatric: She has a normal mood and affect.     ED Course  Procedures (including critical care time) Labs Review Labs Reviewed - No data to display  Imaging Review Dg Knee Complete 4 Views Left  01/23/2014   CLINICAL DATA:  Fall.  Left knee pain.  EXAM: LEFT KNEE - COMPLETE 4+ VIEW  COMPARISON:  10/31/2012  FINDINGS: Mild patellar spurring. No significant knee effusion. No fracture or acute bony findings. Stable mild spurring at the tibial tubercle.  IMPRESSION: 1. Stable spurring.  No acute findings.   Electronically Signed   By: Sherryl Barters M.D.   On: 01/23/2014 19:55     EKG Interpretation None      MDM   Final diagnoses:  Left knee sprain, initial encounter    Filed Vitals:   01/23/14 1929 01/23/14 1941  BP:  131/89  Pulse:  95  Temp:  98.4 F (36.9 C)  TempSrc:  Oral  Resp:  20  Height: 5\' 7"  (1.702 m) 5' 7.5" (1.715 m)  Weight: 247 lb (112.038 kg) 247 lb (112.038 kg)  SpO2:  99%    Medications  HYDROcodone-acetaminophen (NORCO/VICODIN) 5-325 MG per tablet 1 tablet (1 tablet Oral Given 01/23/14 2013)    Victoria Cisneros is a 43 y.o. female presenting with left knee pain status post slip and fall. Physical exam with no gross abnormalities. Joint is stable. X-ray with no fracture. Advise patient RICE. States she has crutches at home.  Evaluation does not show pathology that would require ongoing emergent intervention or inpatient treatment. Pt is hemodynamically stable and mentating appropriately. Discussed findings and plan with patient/guardian, who agrees with care plan. All questions answered. Return precautions discussed and outpatient follow up given.   New Prescriptions   HYDROCODONE-ACETAMINOPHEN (NORCO/VICODIN) 5-325 MG PER TABLET    Take 1-2 tablets by mouth every 6 hours as needed for pain.         Monico Blitz, PA-C 01/23/14 2014

## 2014-01-24 NOTE — ED Provider Notes (Signed)
Medical screening examination/treatment/procedure(s) were performed by non-physician practitioner and as supervising physician I was immediately available for consultation/collaboration.   EKG Interpretation None       Babette Relic, MD 01/24/14 2117

## 2014-03-15 ENCOUNTER — Ambulatory Visit (HOSPITAL_COMMUNITY)
Admission: RE | Admit: 2014-03-15 | Discharge: 2014-03-15 | Disposition: A | Discharge status: Home / Self Care | Payer: BC Managed Care – PPO | Source: Ambulatory Visit | Attending: Family Medicine | Admitting: Family Medicine

## 2014-03-15 ENCOUNTER — Ambulatory Visit (INDEPENDENT_AMBULATORY_CARE_PROVIDER_SITE_OTHER): Payer: BC Managed Care – PPO | Admitting: Family Medicine

## 2014-03-15 ENCOUNTER — Ambulatory Visit (INDEPENDENT_AMBULATORY_CARE_PROVIDER_SITE_OTHER): Payer: BC Managed Care – PPO

## 2014-03-15 ENCOUNTER — Emergency Department (HOSPITAL_COMMUNITY): Payer: BC Managed Care – PPO | Admitting: Anesthesiology

## 2014-03-15 ENCOUNTER — Observation Stay (HOSPITAL_COMMUNITY)
Admission: EM | Admit: 2014-03-15 | Discharge: 2014-03-16 | Disposition: A | Discharge status: Home / Self Care | Payer: BC Managed Care – PPO | Attending: Surgery | Admitting: Surgery

## 2014-03-15 ENCOUNTER — Encounter (HOSPITAL_COMMUNITY): Payer: Self-pay | Admitting: Emergency Medicine

## 2014-03-15 ENCOUNTER — Ambulatory Visit: Admit: 2014-03-15 | Payer: Self-pay | Admitting: Surgery

## 2014-03-15 ENCOUNTER — Encounter (HOSPITAL_COMMUNITY): Payer: BC Managed Care – PPO | Admitting: Anesthesiology

## 2014-03-15 ENCOUNTER — Encounter (HOSPITAL_COMMUNITY)
Admission: EM | Disposition: A | Discharge status: Home / Self Care | Payer: Self-pay | Source: Home / Self Care | Attending: Emergency Medicine

## 2014-03-15 VITALS — BP 116/78 | HR 98 | Temp 98.2°F | Resp 16 | Ht 66.0 in | Wt 244.6 lb

## 2014-03-15 DIAGNOSIS — F411 Generalized anxiety disorder: Secondary | ICD-10-CM | POA: Insufficient documentation

## 2014-03-15 DIAGNOSIS — R197 Diarrhea, unspecified: Secondary | ICD-10-CM

## 2014-03-15 DIAGNOSIS — R1031 Right lower quadrant pain: Secondary | ICD-10-CM

## 2014-03-15 DIAGNOSIS — K3580 Unspecified acute appendicitis: Secondary | ICD-10-CM | POA: Diagnosis present

## 2014-03-15 DIAGNOSIS — R12 Heartburn: Secondary | ICD-10-CM

## 2014-03-15 DIAGNOSIS — F329 Major depressive disorder, single episode, unspecified: Secondary | ICD-10-CM | POA: Diagnosis not present

## 2014-03-15 DIAGNOSIS — R1011 Right upper quadrant pain: Secondary | ICD-10-CM

## 2014-03-15 DIAGNOSIS — F3289 Other specified depressive episodes: Secondary | ICD-10-CM | POA: Insufficient documentation

## 2014-03-15 DIAGNOSIS — D72829 Elevated white blood cell count, unspecified: Secondary | ICD-10-CM

## 2014-03-15 DIAGNOSIS — R110 Nausea: Secondary | ICD-10-CM

## 2014-03-15 HISTORY — PX: LAPAROSCOPIC APPENDECTOMY: SHX408

## 2014-03-15 LAB — POCT CBC
Granulocyte percent: 80.6 %G — AB (ref 37–80)
HEMATOCRIT: 41.1 % (ref 37.7–47.9)
Hemoglobin: 13.7 g/dL (ref 12.2–16.2)
LYMPH, POC: 2.3 (ref 0.6–3.4)
MCH: 29.6 pg (ref 27–31.2)
MCHC: 33.4 g/dL (ref 31.8–35.4)
MCV: 88.6 fL (ref 80–97)
MID (cbc): 1.7 — AB (ref 0–0.9)
MPV: 8.5 fL (ref 0–99.8)
POC GRANULOCYTE: 16.5 — AB (ref 2–6.9)
POC LYMPH PERCENT: 11.2 %L (ref 10–50)
POC MID %: 11.2 %M (ref 0–12)
Platelet Count, POC: 260 10*3/uL (ref 142–424)
RBC: 4.64 M/uL (ref 4.04–5.48)
RDW, POC: 12.9 %
WBC: 20.5 10*3/uL — AB (ref 4.6–10.2)

## 2014-03-15 LAB — I-STAT CHEM 8, ED
BUN: 7 mg/dL (ref 6–23)
CALCIUM ION: 1.14 mmol/L (ref 1.12–1.23)
Chloride: 105 mEq/L (ref 96–112)
Creatinine, Ser: 0.9 mg/dL (ref 0.50–1.10)
Glucose, Bld: 98 mg/dL (ref 70–99)
HEMATOCRIT: 41 % (ref 36.0–46.0)
HEMOGLOBIN: 13.9 g/dL (ref 12.0–15.0)
Potassium: 3.8 mEq/L (ref 3.7–5.3)
SODIUM: 137 meq/L (ref 137–147)
TCO2: 21 mmol/L (ref 0–100)

## 2014-03-15 LAB — POCT URINALYSIS DIPSTICK
Bilirubin, UA: NEGATIVE
GLUCOSE UA: NEGATIVE
Ketones, UA: NEGATIVE
Leukocytes, UA: NEGATIVE
Nitrite, UA: NEGATIVE
PH UA: 5.5
Protein, UA: NEGATIVE
SPEC GRAV UA: 1.02
Urobilinogen, UA: 0.2

## 2014-03-15 LAB — POCT UA - MICROSCOPIC ONLY
CASTS, UR, LPF, POC: NEGATIVE
Crystals, Ur, HPF, POC: NEGATIVE
Yeast, UA: NEGATIVE

## 2014-03-15 LAB — POCT URINE PREGNANCY: PREG TEST UR: NEGATIVE

## 2014-03-15 SURGERY — APPENDECTOMY, LAPAROSCOPIC
Anesthesia: General

## 2014-03-15 MED ORDER — ONDANSETRON HCL 4 MG PO TABS: 4.0000 mg | ORAL_TABLET | Freq: Four times a day (QID) | ORAL | Status: DC | PRN

## 2014-03-15 MED ORDER — HYDROMORPHONE HCL PF 1 MG/ML IJ SOLN
0.2500 mg | INTRAMUSCULAR | Status: DC | PRN
  Administered 2014-03-15 (×2): 0.5 mg via INTRAVENOUS

## 2014-03-15 MED ORDER — FENTANYL CITRATE 0.05 MG/ML IJ SOLN
INTRAMUSCULAR | Status: DC | PRN
  Administered 2014-03-15 (×3): 50 ug via INTRAVENOUS
  Administered 2014-03-15: 100 ug via INTRAVENOUS

## 2014-03-15 MED ORDER — ONDANSETRON HCL 4 MG/2ML IJ SOLN
INTRAMUSCULAR | Status: AC
  Filled 2014-03-15: qty 2

## 2014-03-15 MED ORDER — MIDAZOLAM HCL 2 MG/2ML IJ SOLN
INTRAMUSCULAR | Status: AC
  Filled 2014-03-15: qty 2

## 2014-03-15 MED ORDER — PROPOFOL 10 MG/ML IV BOLUS
INTRAVENOUS | Status: AC
  Filled 2014-03-15: qty 20

## 2014-03-15 MED ORDER — MIDAZOLAM HCL 5 MG/5ML IJ SOLN
INTRAMUSCULAR | Status: DC | PRN
  Administered 2014-03-15: 2 mg via INTRAVENOUS

## 2014-03-15 MED ORDER — PROPOFOL 10 MG/ML IV BOLUS
INTRAVENOUS | Status: DC | PRN
  Administered 2014-03-15: 180 mg via INTRAVENOUS

## 2014-03-15 MED ORDER — GLYCOPYRROLATE 0.2 MG/ML IJ SOLN
INTRAMUSCULAR | Status: DC | PRN
  Administered 2014-03-15: 0.4 mg via INTRAVENOUS

## 2014-03-15 MED ORDER — DEXTROSE 5 % IV SOLN
1.0000 g | Freq: Once | INTRAVENOUS | Status: AC
  Administered 2014-03-15: 1 g via INTRAVENOUS
  Filled 2014-03-15: qty 1

## 2014-03-15 MED ORDER — ONDANSETRON HCL 4 MG/2ML IJ SOLN: 4.0000 mg | Freq: Four times a day (QID) | INTRAMUSCULAR | Status: DC | PRN

## 2014-03-15 MED ORDER — IBUPROFEN 200 MG PO TABS
600.0000 mg | ORAL_TABLET | Freq: Four times a day (QID) | ORAL | Status: DC | PRN
Start: 2014-03-15 — End: 2014-03-16

## 2014-03-15 MED ORDER — SUCCINYLCHOLINE CHLORIDE 20 MG/ML IJ SOLN
INTRAMUSCULAR | Status: DC | PRN
  Administered 2014-03-15: 160 mg via INTRAVENOUS

## 2014-03-15 MED ORDER — ROCURONIUM BROMIDE 100 MG/10ML IV SOLN
INTRAVENOUS | Status: AC
  Filled 2014-03-15: qty 1

## 2014-03-15 MED ORDER — IOHEXOL 300 MG/ML  SOLN
100.0000 mL | Freq: Once | INTRAMUSCULAR | Status: AC | PRN
  Administered 2014-03-15: 100 mL via INTRAVENOUS

## 2014-03-15 MED ORDER — 0.9 % SODIUM CHLORIDE (POUR BTL) OPTIME
TOPICAL | Status: DC | PRN
  Administered 2014-03-15: 1000 mL

## 2014-03-15 MED ORDER — ONDANSETRON HCL 4 MG/2ML IJ SOLN
INTRAMUSCULAR | Status: DC | PRN
  Administered 2014-03-15: 4 mg via INTRAVENOUS

## 2014-03-15 MED ORDER — PROMETHAZINE HCL 25 MG/ML IJ SOLN: 6.2500 mg | INTRAMUSCULAR | Status: DC | PRN

## 2014-03-15 MED ORDER — LACTATED RINGERS IV SOLN
INTRAVENOUS | Status: DC | PRN
  Administered 2014-03-15: 1000 mL

## 2014-03-15 MED ORDER — FENTANYL CITRATE 0.05 MG/ML IJ SOLN
INTRAMUSCULAR | Status: AC
  Filled 2014-03-15: qty 5

## 2014-03-15 MED ORDER — HYDROCODONE-ACETAMINOPHEN 5-325 MG PO TABS: 1.0000 | ORAL_TABLET | ORAL | Status: DC | PRN

## 2014-03-15 MED ORDER — KCL IN DEXTROSE-NACL 30-5-0.45 MEQ/L-%-% IV SOLN
INTRAVENOUS | Status: DC
  Administered 2014-03-15 – 2014-03-16 (×2): via INTRAVENOUS
  Filled 2014-03-15: qty 1000

## 2014-03-15 MED ORDER — LIDOCAINE HCL (CARDIAC) 20 MG/ML IV SOLN
INTRAVENOUS | Status: AC
  Filled 2014-03-15: qty 5

## 2014-03-15 MED ORDER — DEXAMETHASONE SODIUM PHOSPHATE 10 MG/ML IJ SOLN
INTRAMUSCULAR | Status: AC
  Filled 2014-03-15: qty 1

## 2014-03-15 MED ORDER — LACTATED RINGERS IV SOLN
INTRAVENOUS | Status: DC | PRN
  Administered 2014-03-15 (×2): via INTRAVENOUS

## 2014-03-15 MED ORDER — MORPHINE SULFATE 2 MG/ML IJ SOLN: 1.0000 mg | INTRAMUSCULAR | Status: DC | PRN

## 2014-03-15 MED ORDER — BUPIVACAINE-EPINEPHRINE 0.25% -1:200000 IJ SOLN
INTRAMUSCULAR | Status: DC | PRN
  Administered 2014-03-15: 20 mL

## 2014-03-15 MED ORDER — BUPIVACAINE-EPINEPHRINE (PF) 0.25% -1:200000 IJ SOLN
INTRAMUSCULAR | Status: AC
  Filled 2014-03-15: qty 30

## 2014-03-15 MED ORDER — PANTOPRAZOLE SODIUM 40 MG IV SOLR
40.0000 mg | Freq: Every day | INTRAVENOUS | Status: DC
  Administered 2014-03-15: 40 mg via INTRAVENOUS
  Filled 2014-03-15 (×2): qty 40

## 2014-03-15 MED ORDER — HYDROMORPHONE HCL PF 1 MG/ML IJ SOLN
INTRAMUSCULAR | Status: AC
  Filled 2014-03-15: qty 1

## 2014-03-15 MED ORDER — DEXTROSE 5 % IV SOLN
1.0000 g | Freq: Four times a day (QID) | INTRAVENOUS | Status: DC
  Administered 2014-03-15 – 2014-03-16 (×2): 1 g via INTRAVENOUS
  Filled 2014-03-15 (×3): qty 1

## 2014-03-15 MED ORDER — DEXAMETHASONE SODIUM PHOSPHATE 10 MG/ML IJ SOLN
INTRAMUSCULAR | Status: DC | PRN
  Administered 2014-03-15: 10 mg via INTRAVENOUS

## 2014-03-15 MED ORDER — ROCURONIUM BROMIDE 100 MG/10ML IV SOLN
INTRAVENOUS | Status: DC | PRN
  Administered 2014-03-15: 20 mg via INTRAVENOUS

## 2014-03-15 MED ORDER — NEOSTIGMINE METHYLSULFATE 10 MG/10ML IV SOLN
INTRAVENOUS | Status: DC | PRN
  Administered 2014-03-15: 3 mg via INTRAVENOUS

## 2014-03-15 SURGICAL SUPPLY — 40 items
APPLIER CLIP ROT 10 11.4 M/L (STAPLE)
BENZOIN TINCTURE PRP APPL 2/3 (GAUZE/BANDAGES/DRESSINGS) ×2 IMPLANT
CANISTER SUCTION 2500CC (MISCELLANEOUS) ×2 IMPLANT
CLIP APPLIE ROT 10 11.4 M/L (STAPLE) IMPLANT
CUTTER FLEX LINEAR 45M (STAPLE) IMPLANT
DECANTER SPIKE VIAL GLASS SM (MISCELLANEOUS) ×2 IMPLANT
DRAPE LAPAROSCOPIC ABDOMINAL (DRAPES) ×2 IMPLANT
DRAPE LG THREE QUARTER DISP (DRAPES) ×2 IMPLANT
ELECT REM PT RETURN 9FT ADLT (ELECTROSURGICAL) ×2
ELECTRODE REM PT RTRN 9FT ADLT (ELECTROSURGICAL) ×1 IMPLANT
ENDOLOOP SUT PDS II  0 18 (SUTURE)
ENDOLOOP SUT PDS II 0 18 (SUTURE) IMPLANT
GAUZE SPONGE 2X2 8PLY STRL LF (GAUZE/BANDAGES/DRESSINGS) ×1 IMPLANT
GLOVE BIOGEL PI IND STRL 7.0 (GLOVE) ×1 IMPLANT
GLOVE BIOGEL PI INDICATOR 7.0 (GLOVE) ×1
GLOVE SURG ORTHO 8.0 STRL STRW (GLOVE) ×2 IMPLANT
GOWN STRL REUS W/TWL LRG LVL3 (GOWN DISPOSABLE) ×2 IMPLANT
GOWN STRL REUS W/TWL XL LVL3 (GOWN DISPOSABLE) ×4 IMPLANT
KIT BASIN OR (CUSTOM PROCEDURE TRAY) ×2 IMPLANT
PENCIL BUTTON HOLSTER BLD 10FT (ELECTRODE) IMPLANT
POUCH SPECIMEN RETRIEVAL 10MM (ENDOMECHANICALS) IMPLANT
RELOAD 45 VASCULAR/THIN (ENDOMECHANICALS) IMPLANT
RELOAD STAPLE TA45 3.5 REG BLU (ENDOMECHANICALS) ×2 IMPLANT
SET IRRIG TUBING LAPAROSCOPIC (IRRIGATION / IRRIGATOR) IMPLANT
SHEARS HARMONIC ACE PLUS 36CM (ENDOMECHANICALS) IMPLANT
SLEEVE XCEL OPT CAN 5 100 (ENDOMECHANICALS) ×2 IMPLANT
SOLUTION ANTI FOG 6CC (MISCELLANEOUS) ×4 IMPLANT
SPONGE GAUZE 2X2 STER 10/PKG (GAUZE/BANDAGES/DRESSINGS) ×1
STRIP CLOSURE SKIN 1/2X4 (GAUZE/BANDAGES/DRESSINGS) ×2 IMPLANT
SUT MNCRL AB 4-0 PS2 18 (SUTURE) ×2 IMPLANT
SYR CONTROL 10ML LL (SYRINGE) ×2 IMPLANT
TAPE CLOTH 1X10 TAN NS (GAUZE/BANDAGES/DRESSINGS) ×2 IMPLANT
TOWEL OR 17X26 10 PK STRL BLUE (TOWEL DISPOSABLE) ×2 IMPLANT
TRAY FOLEY CATH 14FRSI W/METER (CATHETERS) ×2 IMPLANT
TRAY LAP CHOLE (CUSTOM PROCEDURE TRAY) ×2 IMPLANT
TROCAR BLADELESS OPT 5 75 (ENDOMECHANICALS) ×2 IMPLANT
TROCAR XCEL BLUNT TIP 100MML (ENDOMECHANICALS) ×2 IMPLANT
TROCAR XCEL NON-BLD 11X100MML (ENDOMECHANICALS) ×2 IMPLANT
TUBING INSUFFLATION 10FT LAP (TUBING) ×2 IMPLANT
WATER STERILE IRR 1500ML POUR (IV SOLUTION) ×2 IMPLANT

## 2014-03-15 NOTE — Patient Instructions (Addendum)
CT scan today, then further treatment to be determined. Nothing to eat or drink for now other than contrast for scan.  Please report to [facility] first floor Radiology at 4:15pm today. You will need to drink one bottle of contrast at 2:30 and the other bottle at 3:30 for your contrast scan. After your scan we will give you a call for further instructions.

## 2014-03-15 NOTE — Progress Notes (Signed)
Subjective:    Patient ID: Victoria Cisneros, female    DOB: 01/27/1971, 43 y.o.   MRN: [id number]  HPI Victoria Cisneros is a 43 y.o. female PCP: Rachell Cipro, MD   Started about 4pm yesterday - R sided abdominal pain, then nausea at dinner.  Increased abdominal pain last night.  2am - unable to get comfortable. Dry heaving - no emesis.  Up all night d/t pain. Diarrhea started about 11pm, continued overnight. 3 episodes. No blood in diarrhea - watery stools. Hx of heartburn - has taken prilosec once per day.  Feeling of something getting caught in esophagus for 1 week, but heartburn at same time. Taking omeprazole 20mg  QD. No objective fever, subjectively felt warm. No dysuria, hematuria or other urinary difficulties. No hx of stones. No hx of PUD. Only abdominal surgery - R ovarian cystectomy in May of this year.   No known sick contacts.   Tx: none.    Patient Active Problem List   Diagnosis Date Noted  . Gastrocnemius strain 11/04/2012   Past Medical History  Diagnosis Date  . Allergy   . Anxiety   . Depression    Past Surgical History  Procedure Laterality Date  . Back surgery    . Laparoscopy Right 12/03/2013    Procedure: DIAGNOSTIC LAPAROSCOPY OPERATIVE WITH RIGHT OVARIAN CYSTECTOMY;  Surgeon: Cyril Mourning, MD;  Location: [facility];  Service: Gynecology;  Laterality: Right;  . Ovarian cyst removal     Allergies  Allergen Reactions  . Sulfa Antibiotics Anaphylaxis   Prior to Admission medications   Medication Sig Start Date End Date Taking? Authorizing Provider  PEDIATRIC MULTI VIT-EXTRA C-FA PO Take by mouth once.   Yes Historical Provider, MD  Biotin 5000 MCG CAPS Take 2 capsules by mouth at bedtime.    Historical Provider, MD  omeprazole (PRILOSEC) 10 MG capsule Take 10 mg by mouth daily.    Historical Provider, MD   History   Social History  . Marital Status: Married    Spouse Name: N/A    Number of Children: N/A  . Years of Education: N/A   Occupational  History  . Not on file.   Social History Main Topics  . Smoking status: Never Smoker   . Smokeless tobacco: Not on file  . Alcohol Use: No  . Drug Use: No  . Sexual Activity: Not Currently    Birth Control/ Protection: None   Other Topics Concern  . Not on file   Social History Narrative  . No narrative on file     Review of Systems  Constitutional: Positive for fever (subj. only. ) and appetite change.  Gastrointestinal: Positive for nausea, abdominal pain and diarrhea. Negative for blood in stool.  Skin: Negative for rash.       Objective:   Physical Exam  Vitals reviewed. Constitutional: She is oriented to person, place, and time. She appears well-developed and well-nourished.  HENT:  Head: Normocephalic and atraumatic.  Eyes: Conjunctivae and EOM are normal. Pupils are equal, round, and reactive to light.  Neck: Carotid bruit is not present.  Cardiovascular: Normal rate, regular rhythm, normal heart sounds and intact distal pulses.   Pulmonary/Chest: Effort normal and breath sounds normal.  Abdominal: Soft. Normal appearance. She exhibits no distension, no fluid wave and no pulsatile midline mass. Bowel sounds are increased. There is no hepatosplenomegaly. There is tenderness in the right upper quadrant and right lower quadrant. There is guarding, tenderness at McBurney's point and positive Murphy's sign.  There is no rebound and no CVA tenderness.  Neurological: She is alert and oriented to person, place, and time.  Skin: Skin is warm and dry.  Psychiatric: She has a normal mood and affect. Her behavior is normal.   Filed Vitals:   03/15/14 0832  BP: 116/78  Pulse: 98  Temp: 98.2 F (36.8 C)  TempSrc: Oral  Resp: 16  Height: 5\' 6"  (1.676 m)  Weight: 244 lb 9.6 oz (110.95 kg)  SpO2: 98%    Results for orders placed in visit on 03/15/14  POCT CBC      Result Value Ref Range   WBC 20.5 (*) 4.6 - 10.2 K/uL   Lymph, poc 2.3  0.6 - 3.4   POC LYMPH PERCENT 11.2   10 - 50 %L   MID (cbc) 1.7 (*) 0 - 0.9   POC MID % 11.2  0 - 12 %M   POC Granulocyte 16.5 (*) 2 - 6.9   Granulocyte percent 80.6 (*) 37 - 80 %G   RBC 4.64  4.04 - 5.48 M/uL   Hemoglobin 13.7  12.2 - 16.2 g/dL   HCT, POC 41.1  37.7 - 47.9 %   MCV 88.6  80 - 97 fL   MCH, POC 29.6  27 - 31.2 pg   MCHC 33.4  31.8 - 35.4 g/dL   RDW, POC 12.9     Platelet Count, POC 260  142 - 424 K/uL   MPV 8.5  0 - 99.8 fL  POCT URINE PREGNANCY      Result Value Ref Range   Preg Test, Ur Negative    POCT UA - MICROSCOPIC ONLY      Result Value Ref Range   WBC, Ur, HPF, POC 1-2     RBC, urine, microscopic 1-2     Bacteria, U Microscopic small     Mucus, UA small     Epithelial cells, urine per micros 0-3     Crystals, Ur, HPF, POC neg     Casts, Ur, LPF, POC neg     Yeast, UA neg    POCT URINALYSIS DIPSTICK      Result Value Ref Range   Color, UA dark yellow     Clarity, UA hazy     Glucose, UA neg     Bilirubin, UA neg     Ketones, UA neg     Spec Grav, UA 1.020     Blood, UA small     pH, UA 5.5     Protein, UA neg     Urobilinogen, UA 0.2     Nitrite, UA neg     Leukocytes, UA Negative     [facility] reading (PRIMARY) by  Dr. Carlota Raspberry: acute abdominal series: nonspecific bg findings.     Assessment & Plan:   Victoria Cisneros is a 43 y.o. female RUQ abdominal pain - Plan: POCT CBC, POCT urine pregnancy, POCT UA - Microscopic Only, POCT urinalysis dipstick, DG Abd Acute W/Chest, CT Abdomen Pelvis W Contrast, CANCELED: CT Abdomen Pelvis W Contrast  Diarrhea - Plan: POCT CBC, DG Abd Acute W/Chest  Nausea alone - Plan: DG Abd Acute W/Chest  Heartburn - Plan: POCT CBC  RLQ abdominal pain - Plan: CT Abdomen Pelvis W Contrast, CANCELED: CT Abdomen Pelvis W Contrast  Leukocytosis, unspecified - Plan: CT Abdomen Pelvis W Contrast, CANCELED: CT Abdomen Pelvis W Contrast  Sent for CT abd/pelvis to R?o appendicitis vs cholecystitis given leukocytosis and guarded exam. NPO other than  contrast.    Meds ordered this encounter  Medications  . PEDIATRIC MULTI VIT-EXTRA C-FA PO    Sig: Take by mouth once.   Patient Instructions  CT scan today, then further treatment to be determined. Nothing to eat or drink for now other than contrast for scan.  Please report to [facility] first floor Radiology at 4:15pm today. You will need to drink one bottle of contrast at 2:30 and the other bottle at 3:30 for your contrast scan. After your scan we will give you a call for further instructions.   5:24 PM CT report received: FINDINGS:  Musculoskeletal: No aggressive osseous lesions. Age advanced  thoracolumbar spondylosis. Right-greater-than-left SI joint  osteoarthritis.  Lung Bases: Normal.  Liver: Normal. Fatty infiltration is present adjacent to the  falciform ligament of the liver.  Spleen: Normal.  Gallbladder: Normal.  Common bile duct: Normal.  Pancreas: Normal.  Adrenal glands: Normal bilaterally.  Kidneys: Normal renal enhancement. Subcentimeter LEFT inferior pole  renal cyst. Normal delayed excretion of contrast. No renal calculi.  Both ureters appear within normal limits. Phlebolith incidentally  noted along the distal LEFT ureter near the LEFT UVJ.  Stomach: Normal.  Small bowel: Normal.  Colon: Acute appendicitis is present with periappendiceal phlegmon.  No abscess. No intra-abdominal free air.  Pelvic Genitourinary: Physiologic appearance of the uterus and  adnexa. Urinary bladder normal. Small amount of free fluid in the  anatomic pelvis.  Vasculature: Normal.  Body Wall: Periumbilical scarring.   IMPRESSION:  Uncomplicated acute appendicitis.   5:31 PM  Advised patient of results, charge nurse advised in [facility] - pt walked over to be checked in. Surgeon on call paged, discussed with Dr. Harlow Asa.

## 2014-03-15 NOTE — H&P (Signed)
Victoria Cisneros is an 43 y.o. female.    General Surgery [facility]  Chief Complaint: abdominal pain, acute appendicitis  HPI: patient is a 43 yo WF with 24 hour hx of abdominal pain localizing to the RLQ.  Developed nausea and emesis.  Diarrhea.  Saw primary MD - elevated WBC 20K.  CT abd positive for acute appendicitis.  To [facility] ER for assessment.  IV abx's started by ER MD.  Past Medical History  Diagnosis Date  . Allergy   . Anxiety   . Depression     Past Surgical History  Procedure Laterality Date  . Back surgery    . Laparoscopy Right 12/03/2013    Procedure: DIAGNOSTIC LAPAROSCOPY OPERATIVE WITH RIGHT OVARIAN CYSTECTOMY;  Surgeon: Cyril Mourning, MD;  Location: [facility];  Service: Gynecology;  Laterality: Right;  . Ovarian cyst removal      Family History  Problem Relation Age of Onset  . Diabetes Mother   . Hyperlipidemia Mother   . Hypertension Mother   . Hypertension Father   . Hyperlipidemia Father   . Diabetes Father   . Heart attack Father   . Sudden death Neg Hx    Social History:  reports that she has never smoked. She does not have any smokeless tobacco history on file. She reports that she does not drink alcohol or use illicit drugs.  Allergies:  Allergies  Allergen Reactions  . Sulfa Antibiotics Anaphylaxis     (Not in a hospital admission)  Results for orders placed during the hospital encounter of 03/15/14 (from the past 48 hour(s))  I-STAT CHEM 8, ED     Status: None   Collection Time    03/15/14  6:01 PM      Result Value Ref Range   Sodium 137  137 - 147 mEq/L   Potassium 3.8  3.7 - 5.3 mEq/L   Chloride 105  96 - 112 mEq/L   BUN 7  6 - 23 mg/dL   Creatinine, Ser 0.90  0.50 - 1.10 mg/dL   Glucose, Bld 98  70 - 99 mg/dL   Calcium, Ion 1.14  1.12 - 1.23 mmol/L   TCO2 21  0 - 100 mmol/L   Hemoglobin 13.9  12.0 - 15.0 g/dL   HCT 41.0  36.0 - 46.0 %   Ct Abdomen Pelvis W Contrast  03/15/2014   CLINICAL DATA:  RIGHT lower  quadrant pain for 3 days.  EXAM: CT ABDOMEN AND PELVIS WITH CONTRAST  TECHNIQUE: Multidetector CT imaging of the abdomen and pelvis was performed using the standard protocol following bolus administration of intravenous contrast.  CONTRAST:  118mL OMNIPAQUE IOHEXOL 300 MG/ML  SOLN  COMPARISON:  03/15/2014.  FINDINGS: Musculoskeletal: No aggressive osseous lesions. Age advanced thoracolumbar spondylosis. Right-greater-than-left SI joint osteoarthritis.  Lung Bases: Normal.  Liver: Normal. Fatty infiltration is present adjacent to the falciform ligament of the liver.  Spleen:  Normal.  Gallbladder:  Normal.  Common bile duct:  Normal.  Pancreas:  Normal.  Adrenal glands:  Normal bilaterally.  Kidneys: Normal renal enhancement. Subcentimeter LEFT inferior pole renal cyst. Normal delayed excretion of contrast. No renal calculi. Both ureters appear within normal limits. Phlebolith incidentally noted along the distal LEFT ureter near the LEFT UVJ.  Stomach:  Normal.  Small bowel:  Normal.  Colon: Acute appendicitis is present with periappendiceal phlegmon. No abscess. No intra-abdominal free air.  Pelvic Genitourinary: Physiologic appearance of the uterus and adnexa. Urinary bladder normal. Small amount  of free fluid in the anatomic pelvis.  Vasculature: Normal.  Body Wall: Periumbilical scarring.  IMPRESSION: Uncomplicated acute appendicitis.   Electronically Signed   By: Dereck Ligas M.D.   On: 03/15/2014 16:55   Dg Abd Acute W/chest  03/15/2014   CLINICAL DATA:  Right lower quadrant pain  EXAM: ACUTE ABDOMEN SERIES (ABDOMEN 2 VIEW & CHEST 1 VIEW)  COMPARISON:  None.  FINDINGS: There is no evidence of dilated bowel loops or free intraperitoneal air. No radiopaque calculi or other significant radiographic abnormality is seen. Heart size and mediastinal contours are within normal limits. Both lungs are clear.  IMPRESSION: Negative abdominal radiographs.  No acute cardiopulmonary disease.   Electronically Signed   By:  Kathreen Devoid   On: 03/15/2014 11:47    Review of Systems  Constitutional: Positive for diaphoresis.  HENT: Negative.   Eyes: Negative.   Respiratory: Negative.   Cardiovascular: Negative.   Gastrointestinal: Positive for nausea, vomiting, abdominal pain (RLQ) and diarrhea.  Genitourinary: Negative.   Musculoskeletal: Positive for back pain.  Skin: Negative.   Neurological: Negative.   Endo/Heme/Allergies: Negative.   Psychiatric/Behavioral: Negative.     Blood pressure 127/56, pulse 88, temperature 98.6 F (37 C), temperature source Oral, resp. rate 16, last menstrual period 02/07/2014, SpO2 95.00%. Physical Exam  Constitutional: She is oriented to person, place, and time. She appears well-developed and well-nourished. No distress.  HENT:  Head: Normocephalic and atraumatic.  Right Ear: External ear normal.  Left Ear: External ear normal.  Mouth/Throat: No oropharyngeal exudate.  Eyes: Conjunctivae are normal. Pupils are equal, round, and reactive to light. No scleral icterus.  Neck: Normal range of motion. No thyromegaly present.  Cardiovascular: Normal rate and normal heart sounds.   No murmur heard. Respiratory: Effort normal and breath sounds normal. She has no wheezes.  GI: Soft. She exhibits no distension and no mass. There is tenderness (RLQ). There is guarding. There is no rebound.  Musculoskeletal: Normal range of motion. She exhibits no edema and no tenderness.  Lymphadenopathy:    She has no cervical adenopathy.  Neurological: She is alert and oriented to person, place, and time.  Skin: Skin is warm and dry.  Psychiatric: She has a normal mood and affect. Her behavior is normal.     Assessment/Plan Acute appendicitis  Plan laparoscopic appendectomy  The risks and benefits of the procedure have been discussed at length with the patient.  The patient understands the proposed procedure, potential alternative treatments, and the course of recovery to be expected.   All of the patient's questions have been answered at this time.  The patient wishes to proceed with surgery.  Earnstine Regal, MD, [facility] Office: [place] 03/15/2014, 7:21 PM

## 2014-03-15 NOTE — ED Notes (Signed)
Pt presents with c/o lower abdominal pain on the right side that started yesterday. Pt went to see her doctor today and they sent her for a CT, confirmed appendicitis.

## 2014-03-15 NOTE — ED Provider Notes (Signed)
MSE was initiated and I personally evaluated the patient and placed orders (if any) at  5:34 PM on March 15, 2014.  The patient appears stable so that the remainder of the MSE may be completed by another provider.  Patient was sent in with CT proven appendicitis. Primary care Dr. will be calling general surgery to see her in ER. Has abdominal tenderness. Will be seen primarily by general surgery.  Jasper Riling. Alvino Chapel, MD 03/15/14 1734

## 2014-03-15 NOTE — Op Note (Signed)
OPERATIVE REPORT - LAPAROSCOPIC APPENDECTOMY  Preop diagnosis: Acute appendicitis  Postop diagnosis: Same  Procedure: Laparoscopic appendectomy  Surgeon:  Earnstine Regal, MD, FACS  Anesthesia: General endotracheal  Estimated blood loss: Minimal  Preparation: Chlora-prep  Complications: None  Indications:  43 yo WF with 24 hour hx of abdominal pain localizing to RLQ.  WBC 20K. CTA positive for acute appendicitis.  To OR for lap appendectomy.  Procedure:  Patient is brought to the operating room and placed in a supine position on the operating room table. Following administration of general anesthesia, a time out was held and the patient's name and procedure is confirmed. Patient is then prepped and draped in the usual strict aseptic fashion.  After ascertaining that an adequate level of anesthesia has been achieved, a peri-umbilical incision is made with a #15 blade. Dissection is carried down to the fascia. Fascia is incised in the midline and the peritoneal cavity is entered cautiously. A #0-vicryl pursestring suture is placed in the fascia. An Hassan cannula is introduced under direct vision and secured with the pursestring suture. The abdomen is insufflated with carbon dioxide. The laparoscope is introduced and the abdomen is explored. Operative ports are placed in the right upper quadrant and left lower quadrant. There are adhesions to the umbilical site due to previous laparoscopy.  The omental adhesions are taken down with the Harmonic Scapel.  The terminal ileum and cecum have adhesions to the right adnexa and lateral abdominal wall.  These are taken down with the harmonic scapel and blunt dissection.  The appendix is identified lateral to the cecum. The mesoappendix is divided with the harmonic scalpel. Dissection is carried down to the base of the appendix. The base of the appendix is dissected out clearing the junction with the cecal wall. Using an Endo-GIA stapler, the base of the  appendix is transected at the junction with the cecal wall. There is good approximation of tissue along the staple line. There is good hemostasis along the staple line. The appendix is placed into an endo-catch bag and withdrawn through the umbilical port. The #0-vicryl pursestring suture is tied securely.  Right lower quadrant is irrigated with warm saline which is evacuated. Good hemostasis is noted. Ports are removed under direct vision. Good hemostasis is noted at the port sites. Pneumoperitoneum is released.  Skin incisions are anesthetized with local anesthetic. Wounds are closed with interrupted 4-0 Monocryl subcuticular sutures. Wounds are washed and dried and benzoin and Steri-Strips are applied. Dressings are applied. The patient is awakened from anesthesia and brought to the recovery room. The patient tolerated the procedure well.  Earnstine Regal, MD, [facility] Office: [phone]

## 2014-03-15 NOTE — Anesthesia Preprocedure Evaluation (Signed)
Anesthesia Evaluation  Patient identified by MRN, date of birth, ID band Patient awake    Reviewed: Allergy & Precautions, H&P , NPO status , Patient's Chart, lab work & pertinent test results  Airway Mallampati: II TM Distance: >3 FB Neck ROM: Full    Dental no notable dental hx.    Pulmonary neg pulmonary ROS,  breath sounds clear to auscultation  Pulmonary exam normal       Cardiovascular negative cardio ROS  Rhythm:Regular Rate:Normal     Neuro/Psych PSYCHIATRIC DISORDERS Anxiety Depression  Neuromuscular disease    GI/Hepatic negative GI ROS, Neg liver ROS,   Endo/Other  Morbid obesity  Renal/GU negative Renal ROS  negative genitourinary   Musculoskeletal negative musculoskeletal ROS (+)   Abdominal (+) + obese,   Peds negative pediatric ROS (+)  Hematology negative hematology ROS (+)   Anesthesia Other Findings   Reproductive/Obstetrics negative OB ROS                           Anesthesia Physical Anesthesia Plan  ASA: III and emergent  Anesthesia Plan: General   Post-op Pain Management:    Induction: Intravenous  Airway Management Planned: Oral ETT  Additional Equipment:   Intra-op Plan:   Post-operative Plan: Extubation in OR  Informed Consent: I have reviewed the patients History and Physical, chart, labs and discussed the procedure including the risks, benefits and alternatives for the proposed anesthesia with the patient or authorized representative who has indicated his/her understanding and acceptance.   Dental advisory given  Plan Discussed with: CRNA  Anesthesia Plan Comments:         Anesthesia Quick Evaluation

## 2014-03-15 NOTE — Transfer of Care (Signed)
Immediate Anesthesia Transfer of Care Note  Patient: Victoria Cisneros  Procedure(s) Performed: Procedure(s): APPENDECTOMY LAPAROSCOPIC (N/A)  Patient Location: PACU  Anesthesia Type:General  Level of Consciousness: awake, alert  and oriented  Airway & Oxygen Therapy: Patient Spontanous Breathing and Patient connected to face mask oxygen  Post-op Assessment: Report given to PACU RN and Post -op Vital signs reviewed and stable  Post vital signs: Reviewed and stable  Complications: No apparent anesthesia complications

## 2014-03-15 NOTE — ED Notes (Signed)
Bed: ME26 Expected date:  Expected time:  Means of arrival:  Comments: [place]

## 2014-03-16 ENCOUNTER — Encounter (HOSPITAL_COMMUNITY): Payer: Self-pay | Admitting: Surgery

## 2014-03-16 MED ORDER — ACETAMINOPHEN 500 MG PO TABS: 500.0000 mg | ORAL_TABLET | Freq: Four times a day (QID) | ORAL | Status: DC | PRN

## 2014-03-16 MED ORDER — IBUPROFEN 200 MG PO TABS: 600.0000 mg | ORAL_TABLET | Freq: Four times a day (QID) | ORAL | Status: AC | PRN

## 2014-03-16 MED ORDER — HYDROCODONE-ACETAMINOPHEN 5-325 MG PO TABS: 1.0000 | ORAL_TABLET | Freq: Four times a day (QID) | ORAL | Status: DC | PRN

## 2014-03-16 NOTE — Progress Notes (Signed)
Pt arrived to floor via bed from pacu. VSS on arrival. Pt. Voiced no c/o pain. Lap sites time 3 C,D and I. Pt. In no acute distress will continue to monitor.

## 2014-03-16 NOTE — Progress Notes (Signed)
Utilization review completed.  

## 2014-03-16 NOTE — Anesthesia Postprocedure Evaluation (Signed)
  Anesthesia Post-op Note  Patient: Victoria Cisneros  Procedure(s) Performed: Procedure(s) (LRB): APPENDECTOMY LAPAROSCOPIC (N/A)  Patient Location: PACU  Anesthesia Type: general  Level of Consciousness: awake and alert   Airway and Oxygen Therapy: Patient Spontanous Breathing  Post-op Pain: mild  Post-op Assessment: Post-op Vital signs reviewed, Patient's Cardiovascular Status Stable, Respiratory Function Stable, Patent Airway and No signs of Nausea or vomiting  Last Vitals:  Filed Vitals:   03/16/14 0619  BP: 117/67  Pulse: 56  Temp: 36.8 C  Resp: 18    Post-op Vital Signs: stable   Complications: No apparent anesthesia complications

## 2014-03-16 NOTE — Discharge Summary (Signed)
[facility] Discharge Summary   Patient ID: Victoria Cisneros MRN: [id number] DOB/AGE: 15-Aug-1970 43 y.o.  Admit date: 03/15/2014 Discharge date: 03/16/2014  Admitting Diagnosis: Acute appendicitis  Discharge Diagnosis Patient Active Problem List   Diagnosis Date Noted  . Appendicitis, acute 03/15/2014  . Acute appendicitis 03/15/2014  . Gastrocnemius strain 11/04/2012    Consultants None  Imaging: Ct Abdomen Pelvis W Contrast  03/15/2014   CLINICAL DATA:  RIGHT lower quadrant pain for 3 days.  EXAM: CT ABDOMEN AND PELVIS WITH CONTRAST  TECHNIQUE: Multidetector CT imaging of the abdomen and pelvis was performed using the standard protocol following bolus administration of intravenous contrast.  CONTRAST:  159mL OMNIPAQUE IOHEXOL 300 MG/ML  SOLN  COMPARISON:  03/15/2014.  FINDINGS: Musculoskeletal: No aggressive osseous lesions. Age advanced thoracolumbar spondylosis. Right-greater-than-left SI joint osteoarthritis.  Lung Bases: Normal.  Liver: Normal. Fatty infiltration is present adjacent to the falciform ligament of the liver.  Spleen:  Normal.  Gallbladder:  Normal.  Common bile duct:  Normal.  Pancreas:  Normal.  Adrenal glands:  Normal bilaterally.  Kidneys: Normal renal enhancement. Subcentimeter LEFT inferior pole renal cyst. Normal delayed excretion of contrast. No renal calculi. Both ureters appear within normal limits. Phlebolith incidentally noted along the distal LEFT ureter near the LEFT UVJ.  Stomach:  Normal.  Small bowel:  Normal.  Colon: Acute appendicitis is present with periappendiceal phlegmon. No abscess. No intra-abdominal free air.  Pelvic Genitourinary: Physiologic appearance of the uterus and adnexa. Urinary bladder normal. Small amount of free fluid in the anatomic pelvis.  Vasculature: Normal.  Body Wall: Periumbilical scarring.  IMPRESSION: Uncomplicated acute appendicitis.   Electronically Signed   By: Dereck Ligas M.D.   On: 03/15/2014 16:55   Dg  Abd Acute W/chest  03/15/2014   CLINICAL DATA:  Right lower quadrant pain  EXAM: ACUTE ABDOMEN SERIES (ABDOMEN 2 VIEW & CHEST 1 VIEW)  COMPARISON:  None.  FINDINGS: There is no evidence of dilated bowel loops or free intraperitoneal air. No radiopaque calculi or other significant radiographic abnormality is seen. Heart size and mediastinal contours are within normal limits. Both lungs are clear.  IMPRESSION: Negative abdominal radiographs.  No acute cardiopulmonary disease.   Electronically Signed   By: Kathreen Devoid   On: 03/15/2014 11:47    Procedures Dr. Harlow Asa (03/16/14) - Laparoscopic Appendectomy  Hospital Course:  43 yo WF with 24 hour hx of abdominal pain localizing to the RLQ.  Developed nausea and emesis. Diarrhea. Saw primary MD - elevated WBC 20K. CT abd positive for acute appendicitis. To [facility] ER for assessment. IV abx's started by ER MD.  Patient was admitted and underwent procedure listed above.  Tolerated procedure well and was transferred to the floor.  Diet was advanced as tolerated.  On POD #1, the patient was voiding well, tolerating diet, ambulating well, pain well controlled, vital signs stable, incisions c/d/i and felt stable for discharge home.  Patient will follow up in our office in 3-4 weeks and knows to call with questions or concerns.  The patient has denied any narcotic pain medication to go home with so she is advised to take tylenol/motrin as needed.  Physical Exam: General:  Alert, NAD, pleasant, comfortable Abd:  Soft, ND, mild tenderness, incisions C/D/I with gauze and tape    Medication List         acetaminophen 500 MG tablet  Commonly known as:  TYLENOL  Take 1 tablet (500 mg total) by mouth every 6 (six)  hours as needed.     Biotin 5000 MCG Caps  Take 2 capsules by mouth at bedtime.     ibuprofen 200 MG tablet  Commonly known as:  MOTRIN IB  Take 3 tablets (600 mg total) by mouth every 6 (six) hours as needed.     omeprazole 20 MG capsule  Commonly  known as:  PRILOSEC  Take 20 mg by mouth daily.     PEDIATRIC MULTI VIT-EXTRA C-FA PO  Take 1 tablet by mouth daily.         Follow-up Information   Follow up with [facility] In 3 weeks. (For post-operation check. Call office for time/date.  Please arrive at least 30 min before your appointment to complete your check in paperwork. )    Contact information:   [address]   [place] [address] [phone]       Signed: Excell Seltzer [facility] [phone]  03/16/2014, 12:36 PM

## 2014-03-16 NOTE — Progress Notes (Signed)
Pt consumed about 50% of meal this am. Tolerated diet well. No nausea or vomiting. Will advance to full liquid diet.

## 2014-03-16 NOTE — Discharge Instructions (Signed)
LAPAROSCOPIC SURGERY: POST OP INSTRUCTIONS  1. DIET: Follow a light bland diet the first 24 hours after arrival home, such as soup, liquids, crackers, etc. Be sure to include lots of fluids daily. Avoid fast food or heavy meals as your are more likely to get nauseated. Eat a low fat the next few days after surgery.  2. Take your usually prescribed home medications unless otherwise directed. 3. PAIN CONTROL:  a. Pain is best controlled by a usual combination of three different methods TOGETHER:  i. Ice/Heat ii. Over the counter pain medication iii. Prescription pain medication b. Most patients will experience some swelling and bruising around the incisions. Ice packs or heating pads (30-60 minutes up to 6 times a day) will help. Use ice for the first few days to help decrease swelling and bruising, then switch to heat to help relax tight/sore spots and speed recovery. Some people prefer to use ice alone, heat alone, alternating between ice & heat. Experiment to what works for you. Swelling and bruising can take several weeks to resolve.  c. It is helpful to take an over-the-counter pain medication regularly for the first few weeks. Choose one of the following that works best for you:  i. Naproxen (Aleve, etc) Two 220mg  tabs twice a day ii. Ibuprofen (Advil, etc) Three 200mg  tabs four times a day (every meal & bedtime) iii. Acetaminophen (Tylenol, etc) 500-650mg  four times a day (every meal & bedtime) d. A prescription for pain medication (such as oxycodone, hydrocodone, etc) should be given to you upon discharge. Take your pain medication as prescribed.  i. If you are having problems/concerns with the prescription medicine (does not control pain, nausea, vomiting, rash, itching, etc), please call us [phone] to see if we need to switch you to a different pain medicine that will work better for you and/or control your side effect better. ii. If you need a refill on your pain medication, please  contact your pharmacy. They will contact our office to request authorization. Prescriptions will not be filled after 5 pm or on week-ends. 4. Avoid getting constipated. Between the surgery and the pain medications, it is common to experience some constipation. Increasing fluid intake and taking a fiber supplement (such as Metamucil, Citrucel, FiberCon, MiraLax, etc) 1-2 times a day regularly will usually help prevent this problem from occurring. A mild laxative (prune juice, Milk of Magnesia, MiraLax, etc) should be taken according to package directions if there are no bowel movements after 48 hours.  5. Watch out for diarrhea. If you have many loose bowel movements, simplify your diet to bland foods & liquids for a few days. Stop any stool softeners and decrease your fiber supplement. Switching to mild anti-diarrheal medications (Kayopectate, Pepto Bismol) can help. If this worsens or does not improve, please call us. 6. Wash / shower every day. You may shower over the dressings as they are waterproof. Continue to shower over incision(s) after the dressing is off. 7. Remove your waterproof bandages 5 days after surgery. You may leave the incision open to air. You may replace a dressing/Band-Aid to cover the incision for comfort if you wish.  8. ACTIVITIES as tolerated:  a. You may resume regular (light) daily activities beginning the next day--such as daily self-care, walking, climbing stairs--gradually increasing activities as tolerated. If you can walk 30 minutes without difficulty, it is safe to try more intense activity such as jogging, treadmill, bicycling, low-impact aerobics, swimming, etc. b. Save the most intensive and strenuous activity for  last such as sit-ups, heavy lifting, contact sports, etc Refrain from any heavy lifting or straining until you are off narcotics for pain control.  c. DO NOT PUSH THROUGH PAIN. Let pain be your guide: If it hurts to do something, don't do it. Pain is your body  warning you to avoid that activity for another week until the pain goes down. d. You may drive when you are no longer taking prescription pain medication, you can comfortably wear a seatbelt, and you can safely maneuver your car and apply brakes. e. Dennis Bast may have sexual intercourse when it is comfortable.  9. FOLLOW UP in our office  a. Please call [facility] at [phone] to set up an appointment to see your surgeon in the office for a follow-up appointment approximately 2-3 weeks after your surgery. b. Make sure that you call for this appointment the day you arrive home to insure a convenient appointment time.      10. IF YOU HAVE DISABILITY OR FAMILY LEAVE FORMS, BRING THEM TO THE               OFFICE FOR PROCESSING.   WHEN TO CALL us [phone]:  1. Poor pain control 2. Reactions / problems with new medications (rash/itching, nausea, etc)  3. Fever over 101.5 F (38.5 C) 4. Inability to urinate 5. Nausea and/or vomiting 6. Worsening swelling or bruising 7. Continued bleeding from incision. 8. Increased pain, redness, or drainage from the incision  The clinic staff is available to answer your questions during regular business hours (8:30am-5pm). Please dont hesitate to call and ask to speak to one of our nurses for clinical concerns.  If you have a medical emergency, go to the nearest emergency room or call 911.  A surgeon from [facility] is always on call at the [facility], [place], [place], [place], [place] [address] ?  MAIN: [phone] ? TOLL FREE: [phone] ?  FAX [phone]  [email or web address]

## 2014-03-16 NOTE — Progress Notes (Signed)
Discharge instructions and medications reviewed with patient. Patient verbalizes understanding. Patient confirms all personal belongings are in her possession. Patient has no questions at this time. Patient discharged home.

## 2014-03-17 NOTE — Discharge Summary (Signed)
General Surgery [facility]  Agree.  Earnstine Regal, MD, [facility] Office: [phone]

## 2015-06-06 ENCOUNTER — Other Ambulatory Visit: Payer: Self-pay | Admitting: Family Medicine

## 2015-06-06 DIAGNOSIS — Z1231 Encounter for screening mammogram for malignant neoplasm of breast: Secondary | ICD-10-CM

## 2015-06-06 LAB — BASIC METABOLIC PANEL
BUN: 10 mg/dL (ref 4–21)
CREATININE: 0.9 mg/dL (ref <=1.1)
Glucose: 106 mg/dL
Potassium: 4.2 mmol/L (ref 3.4–5.3)
Sodium: 139 mmol/L (ref 137–147)

## 2015-06-06 LAB — LIPID PANEL
CHOLESTEROL: 221 mg/dL — AB (ref 0–200)
HDL: 56 mg/dL (ref 35–70)
LDL Cholesterol: 141 mg/dL
Triglycerides: 118 mg/dL (ref 40–160)

## 2015-06-06 LAB — CBC AND DIFFERENTIAL
HCT: 40 % (ref 36–46)
Hemoglobin: 13.6 g/dL (ref 12.0–16.0)
PLATELETS: 260 10*3/uL (ref 150–399)
WBC: 10.2 10*3/mL

## 2015-06-06 LAB — TSH: TSH: 2.15 u[IU]/mL (ref <=5.90)

## 2015-06-06 LAB — HEPATIC FUNCTION PANEL
ALK PHOS: 64 U/L (ref 25–125)
ALT: 17 U/L (ref 7–35)
AST: 18 U/L (ref 13–35)
Bilirubin, Total: 0.4 mg/dL

## 2015-06-23 ENCOUNTER — Ambulatory Visit: Payer: Self-pay

## 2016-01-17 ENCOUNTER — Encounter: Payer: Self-pay | Admitting: Family Medicine

## 2016-01-17 ENCOUNTER — Ambulatory Visit (INDEPENDENT_AMBULATORY_CARE_PROVIDER_SITE_OTHER): Payer: BLUE CROSS/BLUE SHIELD | Admitting: Family Medicine

## 2016-01-17 VITALS — BP 127/83 | HR 80 | Ht 65.75 in | Wt 260.6 lb

## 2016-01-17 DIAGNOSIS — R631 Polydipsia: Secondary | ICD-10-CM | POA: Insufficient documentation

## 2016-01-17 DIAGNOSIS — R3589 Other polyuria: Secondary | ICD-10-CM

## 2016-01-17 DIAGNOSIS — R5383 Other fatigue: Secondary | ICD-10-CM | POA: Diagnosis not present

## 2016-01-17 DIAGNOSIS — F4323 Adjustment disorder with mixed anxiety and depressed mood: Secondary | ICD-10-CM | POA: Diagnosis not present

## 2016-01-17 DIAGNOSIS — R358 Other polyuria: Secondary | ICD-10-CM | POA: Diagnosis not present

## 2016-01-17 DIAGNOSIS — G4700 Insomnia, unspecified: Secondary | ICD-10-CM

## 2016-01-17 DIAGNOSIS — Z719 Counseling, unspecified: Secondary | ICD-10-CM

## 2016-01-17 DIAGNOSIS — E559 Vitamin D deficiency, unspecified: Secondary | ICD-10-CM

## 2016-01-17 DIAGNOSIS — E8881 Metabolic syndrome: Secondary | ICD-10-CM

## 2016-01-17 DIAGNOSIS — E282 Polycystic ovarian syndrome: Secondary | ICD-10-CM

## 2016-01-17 DIAGNOSIS — K219 Gastro-esophageal reflux disease without esophagitis: Secondary | ICD-10-CM | POA: Insufficient documentation

## 2016-01-17 DIAGNOSIS — E538 Deficiency of other specified B group vitamins: Secondary | ICD-10-CM

## 2016-01-17 MED ORDER — FLUOXETINE HCL 10 MG PO TABS: ORAL_TABLET | ORAL | Status: DC

## 2016-01-17 NOTE — Patient Instructions (Signed)
Fluoxetine capsules or tablets (Depression/Mood Disorders) What is this medicine? FLUOXETINE (floo OX e teen) belongs to a class of drugs known as selective serotonin reuptake inhibitors (SSRIs). It helps to treat mood problems such as depression, obsessive compulsive disorder, and panic attacks. It can also treat certain eating disorders. This medicine may be used for other purposes; ask your health care provider or pharmacist if you have questions. What should I tell my health care provider before I take this medicine? They need to know if you have any of these conditions: -bipolar disorder or mania -diabetes -glaucoma -liver disease -psychosis -seizures -suicidal thoughts or history of attempted suicide -an unusual or allergic reaction to fluoxetine, other medicines, foods, dyes, or preservatives -pregnant or trying to get pregnant -breast-feeding How should I use this medicine? Take this medicine by mouth with a glass of water. Follow the directions on the prescription label. You can take this medicine with or without food. Take your medicine at regular intervals. Do not take it more often than directed. Do not stop taking this medicine suddenly except upon the advice of your doctor. Stopping this medicine too quickly may cause serious side effects or your condition may worsen. A special MedGuide will be given to you by the pharmacist with each prescription and refill. Be sure to read this information carefully each time. Talk to your pediatrician regarding the use of this medicine in children. While this drug may be prescribed for children as young as 7 years for selected conditions, precautions do apply. Overdosage: If you think you have taken too much of this medicine contact a poison control center or emergency room at once. NOTE: This medicine is only for you. Do not share this medicine with others. What if I miss a dose? If you miss a dose, skip the missed dose and go back to  your regular dosing schedule. Do not take double or extra doses. What may interact with this medicine? Do not take fluoxetine with any of the following medications: -other medicines containing fluoxetine, like Sarafem or Symbyax -cisapride -linezolid -MAOIs like Carbex, Eldepryl, Marplan, Nardil, and Parnate -methylene blue (injected into a vein) -pimozide -thioridazine This medicine may also interact with the following medications: -alcohol -aspirin and aspirin-like medicines -carbamazepine -certain medicines for depression, anxiety, or psychotic disturbances -certain medicines for migraine headaches like almotriptan, eletriptan, frovatriptan, naratriptan, rizatriptan, sumatriptan, zolmitriptan -digoxin -diuretics -fentanyl -flecainide -furazolidone -isoniazid -lithium -medicines for sleep -medicines that treat or prevent blood clots like warfarin, enoxaparin, and dalteparin -NSAIDs, medicines for pain and inflammation, like ibuprofen or naproxen -phenytoin -procarbazine -propafenone -rasagiline -ritonavir -supplements like St. John's wort, kava kava, valerian -tramadol -tryptophan -vinblastine This list may not describe all possible interactions. Give your health care provider a list of all the medicines, herbs, non-prescription drugs, or dietary supplements you use. Also tell them if you smoke, drink alcohol, or use illegal drugs. Some items may interact with your medicine. What should I watch for while using this medicine? Tell your doctor if your symptoms do not get better or if they get worse. Visit your doctor or health care professional for regular checks on your progress. Because it may take several weeks to see the full effects of this medicine, it is important to continue your treatment as prescribed by your doctor. Patients and their families should watch out for new or worsening thoughts of suicide or depression. Also watch out for sudden changes in feelings such as  feeling anxious, agitated, panicky, irritable, hostile, aggressive, impulsive,  severely restless, overly excited and hyperactive, or not being able to sleep. If this happens, especially at the beginning of treatment or after a change in dose, call your health care professional. Dennis Bast may get drowsy or dizzy. Do not drive, use machinery, or do anything that needs mental alertness until you know how this medicine affects you. Do not stand or sit up quickly, especially if you are an older patient. This reduces the risk of dizzy or fainting spells. Alcohol may interfere with the effect of this medicine. Avoid alcoholic drinks. Your mouth may get dry. Chewing sugarless gum or sucking hard candy, and drinking plenty of water may help. Contact your doctor if the problem does not go away or is severe. This medicine may affect blood sugar levels. If you have diabetes, check with your doctor or health care professional before you change your diet or the dose of your diabetic medicine. What side effects may I notice from receiving this medicine? Side effects that you should report to your doctor or health care professional as soon as possible: -allergic reactions like skin rash, itching or hives, swelling of the face, lips, or tongue -breathing problems -confusion -eye pain, changes in vision -fast or irregular heart rate, palpitations -flu-like fever, chills, cough, muscle or joint aches and pains -seizures -suicidal thoughts or other mood changes -swelling or redness in or around the eye -tremors -trouble sleeping -unusual bleeding or bruising -unusually tired or weak -vomiting Side effects that usually do not require medical attention (report to your doctor or health care professional if they continue or are bothersome): -change in sex drive or performance -diarrhea -dry mouth -flushing -headache -increased or decreased appetite -nausea -sweating This list may not describe all possible side  effects. Call your doctor for medical advice about side effects. You may report side effects to FDA at 1-800-FDA-1088. Where should I keep my medicine? Keep out of the reach of children. Store at room temperature between 15 and 30 degrees C (59 and 86 degrees F). Throw away any unused medicine after the expiration date. NOTE: This sheet is a summary. It may not cover all possible information. If you have questions about this medicine, talk to your doctor, pharmacist, or health care provider.    2016, Elsevier/Gold Standard. (2014-06-17 12:40:07)       Stress and Stress Management Stress is a normal reaction to life events. It is what you feel when life demands more than you are used to or more than you can handle. Some stress can be useful. For example, the stress reaction can help you catch the last bus of the day, study for a test, or meet a deadline at work. But stress that occurs too often or for too long can cause problems. It can affect your emotional health and interfere with relationships and normal daily activities. Too much stress can weaken your immune system and increase your risk for physical illness. If you already have a medical problem, stress can make it worse. CAUSES  All sorts of life events may cause stress. An event that causes stress for one person may not be stressful for another person. Major life events commonly cause stress. These may be positive or negative. Examples include losing your job, moving into a new home, getting married, having a baby, or losing a loved one. Less obvious life events may also cause stress, especially if they occur day after day or in combination. Examples include working long hours, driving in traffic, caring for children, being  in debt, or being in a difficult relationship. SIGNS AND SYMPTOMS Stress may cause emotional symptoms including, the following:  Anxiety. This is feeling worried, afraid, on edge, overwhelmed, or out of  control.  Anger. This is feeling irritated or impatient.  Depression. This is feeling sad, down, helpless, or guilty.  Difficulty focusing, remembering, or making decisions. Stress may cause physical symptoms, including the following:   Aches and pains. These may affect your head, neck, back, stomach, or other areas of your body.  Tight muscles or clenched jaw.  Low energy or trouble sleeping. Stress may cause unhealthy behaviors, including the following:   Eating to feel better (overeating) or skipping meals.  Sleeping too little, too much, or both.  Working too much or putting off tasks (procrastination).  Smoking, drinking alcohol, or using drugs to feel better. DIAGNOSIS  Stress is diagnosed through an assessment by your health care provider. Your health care provider will ask questions about your symptoms and any stressful life events.Your health care provider will also ask about your medical history and may order blood tests or other tests. Certain medical conditions and medicine can cause physical symptoms similar to stress. Mental illness can cause emotional symptoms and unhealthy behaviors similar to stress. Your health care provider may refer you to a mental health professional for further evaluation.  TREATMENT  Stress management is the recommended treatment for stress.The goals of stress management are reducing stressful life events and coping with stress in healthy ways.  Techniques for reducing stressful life events include the following:  Stress identification. Self-monitor for stress and identify what causes stress for you. These skills may help you to avoid some stressful events.  Time management. Set your priorities, keep a calendar of events, and learn to say "no." These tools can help you avoid making too many commitments. Techniques for coping with stress include the following:  Rethinking the problem. Try to think realistically about stressful events rather  than ignoring them or overreacting. Try to find the positives in a stressful situation rather than focusing on the negatives.  Exercise. Physical exercise can release both physical and emotional tension. The key is to find a form of exercise you enjoy and do it regularly.  Relaxation techniques. These relax the body and mind. Examples include yoga, meditation, tai chi, biofeedback, deep breathing, progressive muscle relaxation, listening to music, being out in nature, journaling, and other hobbies. Again, the key is to find one or more that you enjoy and can do regularly.  Healthy lifestyle. Eat a balanced diet, get plenty of sleep, and do not smoke. Avoid using alcohol or drugs to relax.  Strong support network. Spend time with family, friends, or other people you enjoy being around.Express your feelings and talk things over with someone you trust. Counseling or talktherapy with a mental health professional may be helpful if you are having difficulty managing stress on your own. Medicine is typically not recommended for the treatment of stress.Talk to your health care provider if you think you need medicine for symptoms of stress. HOME CARE INSTRUCTIONS  Keep all follow-up visits as directed by your health care provider.  Take all medicines as directed by your health care provider. SEEK MEDICAL CARE IF:  Your symptoms get worse or you start having new symptoms.  You feel overwhelmed by your problems and can no longer manage them on your own. SEEK IMMEDIATE MEDICAL CARE IF:  You feel like hurting yourself or someone else.   This information is not  intended to replace advice given to you by your health care provider. Make sure you discuss any questions you have with your health care provider.   Document Released: 12/18/2000 Document Revised: 07/15/2014 Document Reviewed: 02/16/2013 Elsevier Interactive Patient Education [employer].

## 2016-01-17 NOTE — Progress Notes (Signed)
Marjory Sneddon, D.O. Primary care at [facility]:    Chief Complaint  Patient presents with  . Establish Care  . Fatigue    Family history of Diabetes  . Polydipsia   New pt, here to establish care.   HPI: Victoria Cisneros is a pleasant 45 y.o. female who presents to [facility] at [facility] today   Oldest daughter- dev delay- age 36 but mind of 25yo.  Her husband, 84yo daughter + BF, Daughter Otila Kluver 28yo Dev delay, MIL, nephew- 64 yo son- Olen Cordial- all live in her home and she takes care of them all.   None of her biological family members--> just thru marriage.   All of them are her husband's none of her own.   Emotional strain and exhausting to care for these all these people.  Has been really tough emotionally for about 3 months, No SI/HI, just tearful, exhausted all the time, no time for self.    Has h/o GAD and depression. No SI. Sister and Mom on lexapro- no help.  Pt wants to feel better.  GYNCandie Mile, MD Selinda Eon.    She has not been able to get pregnant for many, many years. She had been working with her OB-GYN on this.  H/o Ovarian cyst- has PCOS, and likely metabolic syndrome.  Review of systems is completely negative except for positive anxiety, sleep problems, depression, headaches, generalized muscle and joint aches.  Also positive for change in vision and nighttime urination. Patient has not had her eyes checked by optometry in several years and since putting on extra weight, now at times loses her urine- with cough/ sneeze and occ at night she thinks she must have dream that she was going to the bathroom and found herself urinating.     Past Medical History  Diagnosis Date  . Allergy   . Anxiety   . Depression   . Metabolic syndrome 99991111  . Morbid obesity ([place]) 01/27/2016  . Fatigue likely due to deconditioning 01/29/2012  . Clinical depression 01/29/2012  . Anxiety, generalized 10/25/2010    Overview:  Generalized Anxiety  Disorder   . Cannot sleep 01/29/2012  . h/o chronic LBP (low back pain) 10/23/2010    Overview:  Lower Back Pain       Past Surgical History  Procedure Laterality Date  . Back surgery    . Laparoscopy Right 12/03/2013    Procedure: DIAGNOSTIC LAPAROSCOPY OPERATIVE WITH RIGHT OVARIAN CYSTECTOMY;  Surgeon: Cyril Mourning, MD;  Location: [facility];  Service: Gynecology;  Laterality: Right;  . Ovarian cyst removal    . Laparoscopic appendectomy N/A 03/15/2014    Procedure: APPENDECTOMY LAPAROSCOPIC;  Surgeon: Armandina Gemma, MD;  Location: [facility];  Service: General;  Laterality: N/A;  . Pilonidal cyst excision        Family History  Problem Relation Age of Onset  . Diabetes Mother   . Hyperlipidemia Mother   . Hypertension Mother   . Cancer Mother     breast  . Stroke Mother   . Hypertension Father   . Hyperlipidemia Father   . Diabetes Father   . Heart attack Father   . Cancer Father     lung  . Sudden death Neg Hx       History  Drug Use No  ,    History  Alcohol Use No  ,    History  Smoking status  . Never Smoker  Smokeless tobacco  . Never Used  ,     History  Sexual Activity  . Sexual Activity: Yes  . Birth Control/ Protection: None      Patient's Medications  New Prescriptions   FLUOXETINE (PROZAC) 10 MG TABLET    Use one half tablet daily for 1 week then increase to 1 tablet daily  Previous Medications   ACETAMINOPHEN (TYLENOL) 500 MG TABLET    Take 1 tablet (500 mg total) by mouth every 6 (six) hours as needed.   BIOTIN 5000 MCG CAPS    Take 2 capsules by mouth at bedtime.   CHOLECALCIFEROL (D-3-5) 5000 UNITS CAPSULE    Take 5,000 Units by mouth daily.   IBUPROFEN (MOTRIN IB) 200 MG TABLET    Take 3 tablets (600 mg total) by mouth every 6 (six) hours as needed.   VITAMIN B-12 (CYANOCOBALAMIN) 500 MCG TABLET    Take 500 mcg by mouth daily.  Modified Medications   No medications on file  Discontinued Medications   OMEPRAZOLE (PRILOSEC) 20 MG  CAPSULE    Take 20 mg by mouth daily.   PEDIATRIC MULTI VIT-EXTRA C-FA PO    Take 1 tablet by mouth daily.    Review of Systems:   ( Completed via Adult Medical History Intake form today )   Objective:   Blood pressure 127/83, pulse 80, height 5' 5.75" (1.67 m), weight 260 lb 9.6 oz (118.207 kg), last menstrual period 01/12/2016. Body mass index is 42.38 kg/(m^2).  General: Well Developed, well nourished, and in no acute distress.  Neuro: Alert and oriented x3, extra-ocular muscles intact, sensation grossly intact.  HEENT: Normocephalic, atraumatic, pupils equal round reactive to light, neck supple, no gross masses, no carotid bruits, no JVD apprec Skin: no gross suspicious lesions or rashes  Cardiac: Regular rate and rhythm, no murmurs rubs or gallops.  Respiratory: Essentially clear to auscultation bilaterally. Not using accessory muscles, speaking in full sentences.  Abdominal: Soft, not grossly distended Musculoskeletal: Ambulates w/o diff, FROM * 4 ext.  Vasc: less 2 sec cap RF, warm and pink  Psych:  No HI/SI, judgement and insight good.    Impression and Recommendations:    1. Adjustment disorder with mixed anxiety and depressed mood   2. Other fatigue   3. Morbid obesity, unspecified obesity type ([place])   4. Polyuria   5. Gastroesophageal reflux disease, esophagitis presence not specified   6. PCOS (polycystic ovarian syndrome)   7. Metabolic syndrome   8. B12 nutritional deficiency   9. Low vitamin D   10. Cannot sleep   11. Health education/counseling     The patient was counseled, risk factors were discussed, anticipatory guidance given. Pt was in the office today for 40+ minutes, with over 50% time spent in face to face counseling of various medical concerns and in coordination of care  Gross side effects, risk and benefits, and alternatives of medications discussed with patient.  Patient is aware that all medications have potential side effects and we are unable to  predict every side effect or drug-drug interaction that may occur.  Expresses verbal understanding and consents to current therapy plan and treatment regimen.  Please see AVS handed out to patient at the end of our visit for further patient instructions/ counseling done pertaining to today's office visit.    Meds ordered this encounter  Medications  . vitamin B-12 (CYANOCOBALAMIN) 500 MCG tablet    Sig: Take 500 mcg by mouth daily.  . Cholecalciferol (D-3-5)  5000 units capsule    Sig: Take 5,000 Units by mouth daily.  Marland Kitchen FLUoxetine (PROZAC) 10 MG tablet    Sig: Use one half tablet daily for 1 week then increase to 1 tablet daily    Dispense:  30 tablet    Refill:  0    Orders Placed This Encounter  Procedures  . CBC with Differential/Platelet  . Comprehensive metabolic panel  . TSH  . VITAMIN D 25 Hydroxy (Vit-D Deficiency, Fractures)  . Lipid panel  . Vitamin B12  . HgB A1c     Note: This document was prepared using Dragon voice recognition software and may include unintentional dictation errors.

## 2016-01-18 DIAGNOSIS — F4323 Adjustment disorder with mixed anxiety and depressed mood: Secondary | ICD-10-CM | POA: Insufficient documentation

## 2016-01-18 LAB — COMPREHENSIVE METABOLIC PANEL
ALT: 16 U/L (ref 6–29)
AST: 17 U/L (ref 10–30)
Albumin: 3.7 g/dL (ref 3.6–5.1)
Alkaline Phosphatase: 53 U/L (ref 33–115)
BUN: 11 mg/dL (ref 7–25)
CHLORIDE: 104 mmol/L (ref 98–110)
CO2: 24 mmol/L (ref 20–31)
CREATININE: 0.86 mg/dL (ref 0.50–1.10)
Calcium: 8.7 mg/dL (ref 8.6–10.2)
GLUCOSE: 106 mg/dL — AB (ref 65–99)
Potassium: 5.2 mmol/L (ref 3.5–5.3)
SODIUM: 140 mmol/L (ref 135–146)
TOTAL PROTEIN: 6.6 g/dL (ref 6.1–8.1)
Total Bilirubin: 0.4 mg/dL (ref 0.2–1.2)

## 2016-01-18 LAB — CBC WITH DIFFERENTIAL/PLATELET
BASOS ABS: 0 {cells}/uL (ref 0–200)
Basophils Relative: 0 %
EOS PCT: 2 %
Eosinophils Absolute: 162 cells/uL (ref 15–500)
HCT: 40.6 % (ref 35.0–45.0)
Hemoglobin: 13.4 g/dL (ref 11.7–15.5)
Lymphocytes Relative: 25 %
Lymphs Abs: 2025 cells/uL (ref 850–3900)
MCH: 29.3 pg (ref 27.0–33.0)
MCHC: 33 g/dL (ref 32.0–36.0)
MCV: 88.8 fL (ref 80.0–100.0)
MONOS PCT: 7 %
MPV: 10.1 fL (ref 7.5–12.5)
Monocytes Absolute: 567 cells/uL (ref 200–950)
Neutro Abs: 5346 cells/uL (ref 1500–7800)
Neutrophils Relative %: 66 %
PLATELETS: 267 10*3/uL (ref 140–400)
RBC: 4.57 MIL/uL (ref 3.80–5.10)
RDW: 13 % (ref 11.0–15.0)
WBC: 8.1 10*3/uL (ref 3.8–10.8)

## 2016-01-18 LAB — LIPID PANEL
Cholesterol: 206 mg/dL — ABNORMAL HIGH (ref 125–200)
HDL: 51 mg/dL (ref >=46)
LDL CALC: 128 mg/dL (ref <130)
Total CHOL/HDL Ratio: 4 Ratio (ref <=5.0)
Triglycerides: 133 mg/dL (ref <150)
VLDL: 27 mg/dL (ref <30)

## 2016-01-18 LAB — HEMOGLOBIN A1C
Hgb A1c MFr Bld: 5.5 % (ref <5.7)
Mean Plasma Glucose: 111 mg/dL

## 2016-01-18 LAB — TSH: TSH: 1.92 m[IU]/L

## 2016-01-18 LAB — VITAMIN D 25 HYDROXY (VIT D DEFICIENCY, FRACTURES): Vit D, 25-Hydroxy: 29 ng/mL — ABNORMAL LOW (ref 30–100)

## 2016-01-18 LAB — VITAMIN B12: VITAMIN B 12: 506 pg/mL (ref 200–1100)

## 2016-01-26 ENCOUNTER — Encounter: Payer: Self-pay | Admitting: Family Medicine

## 2016-01-27 ENCOUNTER — Encounter: Payer: Self-pay | Admitting: Family Medicine

## 2016-01-27 DIAGNOSIS — E538 Deficiency of other specified B group vitamins: Secondary | ICD-10-CM | POA: Insufficient documentation

## 2016-01-27 DIAGNOSIS — E8881 Metabolic syndrome: Secondary | ICD-10-CM

## 2016-01-27 DIAGNOSIS — Z719 Counseling, unspecified: Secondary | ICD-10-CM | POA: Insufficient documentation

## 2016-01-27 DIAGNOSIS — E282 Polycystic ovarian syndrome: Secondary | ICD-10-CM | POA: Insufficient documentation

## 2016-01-27 HISTORY — DX: Morbid (severe) obesity due to excess calories: E66.01

## 2016-01-27 HISTORY — DX: Metabolic syndrome: E88.81

## 2016-01-31 ENCOUNTER — Ambulatory Visit (INDEPENDENT_AMBULATORY_CARE_PROVIDER_SITE_OTHER): Payer: BLUE CROSS/BLUE SHIELD | Admitting: Family Medicine

## 2016-01-31 ENCOUNTER — Encounter: Payer: Self-pay | Admitting: Family Medicine

## 2016-01-31 VITALS — BP 128/84 | HR 97 | Wt 259.0 lb

## 2016-01-31 DIAGNOSIS — R5383 Other fatigue: Secondary | ICD-10-CM

## 2016-01-31 DIAGNOSIS — E538 Deficiency of other specified B group vitamins: Secondary | ICD-10-CM

## 2016-01-31 DIAGNOSIS — E559 Vitamin D deficiency, unspecified: Secondary | ICD-10-CM | POA: Diagnosis not present

## 2016-01-31 DIAGNOSIS — F329 Major depressive disorder, single episode, unspecified: Secondary | ICD-10-CM | POA: Diagnosis not present

## 2016-01-31 DIAGNOSIS — F32A Depression, unspecified: Secondary | ICD-10-CM

## 2016-01-31 DIAGNOSIS — S86112D Strain of other muscle(s) and tendon(s) of posterior muscle group at lower leg level, left leg, subsequent encounter: Secondary | ICD-10-CM

## 2016-01-31 DIAGNOSIS — F4323 Adjustment disorder with mixed anxiety and depressed mood: Secondary | ICD-10-CM

## 2016-01-31 DIAGNOSIS — R030 Elevated blood-pressure reading, without diagnosis of hypertension: Secondary | ICD-10-CM

## 2016-01-31 DIAGNOSIS — Z719 Counseling, unspecified: Secondary | ICD-10-CM

## 2016-01-31 MED ORDER — FLUOXETINE HCL 10 MG PO TABS: ORAL_TABLET | ORAL | 0 refills | Status: DC

## 2016-01-31 MED ORDER — VITAMIN D (ERGOCALCIFEROL) 1.25 MG (50000 UNIT) PO CAPS: 50000.0000 [IU] | ORAL_CAPSULE | ORAL | 10 refills | Status: DC

## 2016-01-31 NOTE — Progress Notes (Signed)
Assessment and plan:  1. Prehypertension   2. Morbid obesity, unspecified obesity type ([place])   3. Clinical depression   4. Low vitamin D   5. Other fatigue   6. Health education/counseling   7. Adjustment disorder with mixed anxiety and depressed mood   8. B12 nutritional deficiency   9. Gastrocnemius strain, left, subsequent encounter      Patient's blood pressure is little change from prior but still in the pre-hypertension range. Counseling done    Made goals for patient's weight loss. She will track everything on my feta spell over the next 2-4 weeks and follow-up with me.    She will walk 10 minutes twice daily.    Recommend stretching of her gastrocnemius muscle and adequate warmup prior to exercise. Next    Counseling done regarding fatigue and how that is strongly related to diet and activity levels. Pt was in the office today for 40+ minutes, with over 50% time spent in face to face counseling of various medical concerns and in coordination of care     Patient's Medications  New Prescriptions   VITAMIN D, ERGOCALCIFEROL, (DRISDOL) 50000 UNITS CAPS CAPSULE    Take 1 capsule (50,000 Units total) by mouth every 7 (seven) days.  Previous Medications   ACETAMINOPHEN (TYLENOL) 500 MG TABLET    Take 1 tablet (500 mg total) by mouth every 6 (six) hours as needed.   BIOTIN 5000 MCG CAPS    Take 2 capsules by mouth at bedtime.   CHOLECALCIFEROL (D-3-5) 5000 UNITS CAPSULE    Take 5,000 Units by mouth daily.   IBUPROFEN (MOTRIN IB) 200 MG TABLET    Take 3 tablets (600 mg total) by mouth every 6 (six) hours as needed.   VITAMIN B-12 (CYANOCOBALAMIN) 500 MCG TABLET    Take 500 mcg by mouth daily.  Modified Medications   Modified Medication Previous Medication   FLUOXETINE (PROZAC) 10 MG TABLET FLUoxetine (PROZAC) 10 MG tablet      Take 1-2 tablets  daily    Use one half tablet daily for 1 week then  increase to 1 tablet daily  Discontinued Medications   No medications on file    Anticipatory guidance and routine counseling done re: condition, txmnt options and need for follow up. All questions of patient's were answered.   Gross side effects, risk and benefits, and alternatives of medications discussed with patient.  Patient is aware that all medications have potential side effects and we are unable to predict every sideeffect or drug-drug interaction that may occur.  Expresses verbal understanding and consents to current therapy plan and treatment regiment.  Please see AVS handed out to patient at the end of our visit for additional patient instructions/ counseling done pertaining to today's office visit.  Note: This document was prepared using Dragon voice recognition software and may include unintentional dictation errors.   ----------------------------------------------------------------------------------------------------------------------  Subjective:   CC: Victoria Cisneros is a 45 y.o. female who presents to [place] at [facility] today for f/up Labs review and multiple medical concerns.  We started patient on fluoxetine last office visit. 10 mg. She is tolerating this well and she feels her mood is more stable. However, she is keeping this from her husband as he is not very supportive or loving to her.    Vitamin D deficiency on her labs today. Has had this first she has had this for some time since January when her last  doctor told her she needed take the 5000 daily.   Has been exercising at least 10 minutes to 15 minutes daily. She has a old left calf injury which she tore her gastrocnemius muscle. This gets tender and tight and sore after about 10 minutes of walking.  She does not do any formal stretching or strengthening of this. She injured it approximately 2 years ago and never went to physical therapy etc.   Patient has tried to cut back on her carbs and  make healthier choices with her diet. She has lost 1 pound.  Full medical history updated and reviewed in the office today  Patient Active Problem List   Diagnosis Date Noted  . Morbid obesity ([place]) 01/27/2016    Priority: High  . Adjustment disorder with mixed anxiety and depressed mood 01/18/2016    Priority: High  . Clinical depression 01/29/2012    Priority: High  . Acute stress disorder 01/29/2012    Priority: High  . Anxiety, generalized 10/25/2010    Priority: High  . Prehypertension 01/31/2016  . PCOS (polycystic ovarian syndrome) 01/27/2016  . Metabolic syndrome 40/98/1191  . B12 nutritional deficiency 01/27/2016  . Health education/counseling 01/27/2016  . Esophageal reflux 01/17/2016  . Polydipsia 01/17/2016  . s/p Appy- 03/16/14 03/15/2014  . L Gastrocnemius strain 11/04/2012  . Low vitamin D 02/04/2012  . Fatigue likely due to deconditioning 01/29/2012  . Cannot sleep 01/29/2012  . Panic attack 01/29/2012  . Allergic rhinitis- spring 10/23/2010  . h/o chronic LBP- ( 3 sx) 10/23/2010    Past Medical History:  Diagnosis Date  . Allergy   . Anxiety   . Anxiety, generalized 10/25/2010   Overview:  Generalized Anxiety Disorder   . Cannot sleep 01/29/2012  . Clinical depression 01/29/2012  . Depression   . Fatigue likely due to deconditioning 01/29/2012  . h/o chronic LBP (low back pain) 10/23/2010   Overview:  Lower Back Pain   . Metabolic syndrome 4/78/2956  . Morbid obesity ([place]) 01/27/2016    Past Surgical History:  Procedure Laterality Date  . BACK SURGERY    . LAPAROSCOPIC APPENDECTOMY N/A 03/15/2014   Procedure: APPENDECTOMY LAPAROSCOPIC;  Surgeon: Armandina Gemma, MD;  Location: [facility];  Service: General;  Laterality: N/A;  . LAPAROSCOPY Right 12/03/2013   Procedure: DIAGNOSTIC LAPAROSCOPY OPERATIVE WITH RIGHT OVARIAN CYSTECTOMY;  Surgeon: Cyril Mourning, MD;  Location: [facility];  Service: Gynecology;  Laterality: Right;  . OVARIAN CYST REMOVAL    . PILONIDAL  CYST EXCISION      Social History  Substance Use Topics  . Smoking status: Never Smoker  . Smokeless tobacco: Never Used  . Alcohol use No    family history includes Cancer in her father and mother; Diabetes in her father and mother; Heart attack in her father; Hyperlipidemia in her father and mother; Hypertension in her father and mother; Stroke in her mother.   Medications: Current Outpatient Prescriptions  Medication Sig Dispense Refill  . acetaminophen (TYLENOL) 500 MG tablet Take 1 tablet (500 mg total) by mouth every 6 (six) hours as needed. 30 tablet 0  . Biotin 5000 MCG CAPS Take 2 capsules by mouth at bedtime.    . Cholecalciferol (D-3-5) 5000 units capsule Take 5,000 Units by mouth daily.    Marland Kitchen FLUoxetine (PROZAC) 10 MG tablet Take 1-2 tablets  daily 135 tablet 0  . ibuprofen (MOTRIN IB) 200 MG tablet Take 3 tablets (600 mg total) by mouth every 6 (six) hours as needed. Silex  tablet 0  . vitamin B-12 (CYANOCOBALAMIN) 500 MCG tablet Take 500 mcg by mouth daily.    . Vitamin D, Ergocalciferol, (DRISDOL) 50000 units CAPS capsule Take 1 capsule (50,000 Units total) by mouth every 7 (seven) days. 12 capsule 10   No current facility-administered medications for this visit.     Allergies:  Allergies  Allergen Reactions  . Sulfa Antibiotics Anaphylaxis     ROS:  Const:    no fevers, chills Eyes:    conjunctiva clear, no vision changes or blurred vision ENT:  no hearing difficulties, no dysphagia, no dysphonia, no nose bleeds CV:   no chest pain, arrhythmias, no orthopnea, no PND Pulm:   no SOB at rest or exertion, no Wheeze, no DIB, no hemoptysis GI:    no N/V/D/C, no abd pain GU:   no blood in urine or inc freq or urgency Heme/Onc:    no unexplained bleeding, no night sweats, no more fatigue than usual Neuro:   No dizziness, no LOC, No unexplained weakness or numbness Endo:   no unexplained wt loss or gain M-Sk:   no localized myalgias or arthralgias Psych:    No SI/HI,  no memory prob or unexplained confusion    Objective:  Exam:  BP 128/84 (BP Location: Right Arm, Patient Position: Sitting, Cuff Size: Large)   Pulse 97   Wt 259 lb (117.5 kg)   LMP 01/12/2016 (Exact Date)   BMI 42.12 kg/m  Blood pressure 128/84, pulse 97, weight 259 lb (117.5 kg), last menstrual period 01/12/2016. Body mass index is 42.12 kg/m.   BP Readings from Last 3 Encounters:  01/31/16 128/84  01/17/16 127/83  03/16/14 117/67    Gen: Well NAD, A and O *3 HEENT: [place]/AT, EOMI,  MMM, OP- clr Lungs: Normal work of breathing. CTA B/L, no Wh, rhonchi Heart: RRR, S1, S2 WNL's, no MRG Abd: Soft. No gross distention Exts: warm, pink,  Brisk capillary refill, warm and well perfused.   Recent Results (from the past 2160 hour(s))  CBC with Differential/Platelet     Status: None   Collection Time: 01/17/16  9:07 AM  Result Value Ref Range   WBC 8.1 3.8 - 10.8 K/uL   RBC 4.57 3.80 - 5.10 MIL/uL   Hemoglobin 13.4 11.7 - 15.5 g/dL   HCT 40.6 35.0 - 45.0 %   MCV 88.8 80.0 - 100.0 fL   MCH 29.3 27.0 - 33.0 pg   MCHC 33.0 32.0 - 36.0 g/dL   RDW 13.0 11.0 - 15.0 %   Platelets 267 140 - 400 K/uL   MPV 10.1 7.5 - 12.5 fL   Neutro Abs 5,346 1,500 - 7,800 cells/uL   Lymphs Abs 2,025 850 - 3,900 cells/uL   Monocytes Absolute 567 200 - 950 cells/uL   Eosinophils Absolute 162 15 - 500 cells/uL   Basophils Absolute 0 0 - 200 cells/uL   Neutrophils Relative % 66 %   Lymphocytes Relative 25 %   Monocytes Relative 7 %   Eosinophils Relative 2 %   Basophils Relative 0 %   Smear Review Criteria for review not met     Comment: ** Please note change in unit of measure and reference range(s). **  Comprehensive metabolic panel     Status: Abnormal   Collection Time: 01/17/16  9:07 AM  Result Value Ref Range   Sodium 140 135 - 146 mmol/L   Potassium 5.2 3.5 - 5.3 mmol/L   Chloride 104 98 - 110 mmol/L  CO2 24 20 - 31 mmol/L   Glucose, Bld 106 (H) 65 - 99 mg/dL   BUN 11 7 - 25 mg/dL    Creat 0.86 0.50 - 1.10 mg/dL   Total Bilirubin 0.4 0.2 - 1.2 mg/dL   Alkaline Phosphatase 53 33 - 115 U/L   AST 17 10 - 30 U/L   ALT 16 6 - 29 U/L   Total Protein 6.6 6.1 - 8.1 g/dL   Albumin 3.7 3.6 - 5.1 g/dL   Calcium 8.7 8.6 - 10.2 mg/dL  TSH     Status: None   Collection Time: 01/17/16  9:07 AM  Result Value Ref Range   TSH 1.92 mIU/L    Comment:   Reference Range   > or = 20 Years  0.40-4.50   Pregnancy Range First trimester  0.26-2.66 Second trimester 0.55-2.73 Third trimester  0.43-2.91     VITAMIN D 25 Hydroxy (Vit-D Deficiency, Fractures)     Status: Abnormal   Collection Time: 01/17/16  9:07 AM  Result Value Ref Range   Vit D, 25-Hydroxy 29 (L) 30 - 100 ng/mL    Comment: Vitamin D Status           25-OH Vitamin D        Deficiency                <20 ng/mL        Insufficiency         20 - 29 ng/mL        Optimal             > or = 30 ng/mL   For 25-OH Vitamin D testing on patients on D2-supplementation and patients for whom quantitation of D2 and D3 fractions is required, the QuestAssureD 25-OH VIT D, (D2,D3), LC/MS/MS is recommended: order code [phone] (patients > 2 yrs).   Lipid panel     Status: Abnormal   Collection Time: 01/17/16  9:07 AM  Result Value Ref Range   Cholesterol 206 (H) 125 - 200 mg/dL   Triglycerides 133 <150 mg/dL   HDL 51 >=46 mg/dL   Total CHOL/HDL Ratio 4.0 <=5.0 Ratio   VLDL 27 <30 mg/dL   LDL Cholesterol 128 <130 mg/dL    Comment:   Total Cholesterol/HDL Ratio:CHD Risk                        Coronary Heart Disease Risk Table                                        Men       Women          1/2 Average Risk              3.4        3.3              Average Risk              5.0        4.4           2X Average Risk              9.6        7.1           3X Average Risk  23.4       11.0 Use the calculated Patient Ratio above and the CHD Risk table  to determine the patient's CHD Risk.   Vitamin B12     Status: None    Collection Time: 01/17/16  9:07 AM  Result Value Ref Range   Vitamin B-12 506 200 - 1,100 pg/mL  HgB A1c     Status: None   Collection Time: 01/17/16  9:07 AM  Result Value Ref Range   Hgb A1c MFr Bld 5.5 <5.7 %    Comment:   For the purpose of screening for the presence of diabetes:   <5.7%       Consistent with the absence of diabetes 5.7-6.4 %   Consistent with increased risk for diabetes (prediabetes) >=6.5 %     Consistent with diabetes   This assay result is consistent with a decreased risk of diabetes.   Currently, no consensus exists regarding use of hemoglobin A1c for diagnosis of diabetes in children.   According to American Diabetes Association (ADA) guidelines, hemoglobin A1c <7.0% represents optimal control in non-pregnant diabetic patients. Different metrics may apply to specific patient populations. Standards of Medical Care in Diabetes (ADA).      Mean Plasma Glucose 111 mg/dL

## 2016-01-31 NOTE — Patient Instructions (Addendum)
- work on eating less sat and trans fats  Walk 10 min and 29min daily  Track all that you eat on my fitness pal. EVERYTHING you put into your mouth.    F/up 2-4 wks      Top Ten Foods for Health  1. Water Drink at least 8 to 12 cups of water daily. Consume half of your body weight in pounds, is the amount of water in ounces to drink daily.  Ie: a 200lb person = 100 oz water daily  2. Dark Green Vegetables Eat dark green vegetables at least three to four times a week. Good options include broccoli, peppers, brussel sprouts and leafy greens like kale and spinach.  3. Whole Grains Whole grains should be included in your diet at least two to three times daily. Look for whole wheat flour, rye, oatmeal, barley, amaranth, quinoa or a multigrain. A good source of fiber includes 3 to 4 grams of fiber per serving. A great source has 5 or more grams of fiber per serving.  4. Beans and Lentils Try to eat a bean-based meal at least once a week. Try to add legumes, including beans and lentils, to soups, stews, casseroles, salads and dips or eat them plain.  5. Fish Try to eat two to three serving of fish a week. A serving consists of 3 to 4 ounces of cooked fish. Good choices are salmon, trout, herring, bluefish, sardines and tuna.  6. Berries Include two to four servings of fruit in your diet each day. Try to eat berries such as raspberries, blueberries, blackberries and strawberries.  7. Winter Squash Eat butternut and acorn squash as well as other richly pigmented dark orange and green colored vegetables like sweet potato, cantaloupe and mango.  8. Soy 25 grams of soy protein a day is recommended as part of a low-fat diet to help lower cholesterol levels. Try tofu, soymilk, edamame soybeans, tempeh and texturized vegetable protein (TVP).  9. Flaxseed, Nuts and Seeds Add 1 to 2 tablespoons of ground flaxseed or other seeds to food each day or include a moderate amount of nuts - 1/4 cup -  in your daily diet.  10. Organic Yogurt Men and women between 37 and 59 years of age need 1000 milligrams of calcium a day and 1200 milligrams if 84 or older. Eat calcium-rich foods such as nonfat or low-fat dairy products three to four times a day. Include organic choices.         Guidelines for Losing Weight  Since food equals calories, in order to lose weight you must either eat fewer calories, exercise more to burn off calories with activity, or both. Food that is not used to fuel the body is stored as fat.  A major component of losing weight is to make smarter food choices. Here's how:  1)   Limit non-nutritious foods, such as: Sugar, honey, syrups and candy Pastries, donuts, pies, cakes and cookies Soft drinks, sweetened juices and alcoholic beverages  2)  Cut down on high-fat foods by: - Choosing poultry, fish or lean red meat - Choosing low-fat cooking methods, such as baking, broiling, steaming, grilling and boiling - Using low-fat or non-fat dairy products - Using vinaigrette, herbs, lemon or fat-free salad dressings - Avoiding fatty meats, such as bacon, sausage, franks, ribs and luncheon meats - Avoiding high-fat snacks like nuts, chips and chocolate - Avoiding fried foods - Using less butter, margarine, oil and mayonnaise - Avoiding high-fat gravies, cream sauces and cream-based  soups  3) Eat a variety of foods, including: - Fruit and vegetables that are raw, steamed or baked - Whole grains, breads, cereal, rice and pasta - Dairy products, such as low-fat or non-fat milk or yogurt, low-fat cottage cheese and low-fat cheese - Protein-rich foods like chicken, [place], fish, lean meat and legumes, or beans  4) Change your eating habits by: - Eat three balanced meals a day to help control your hunger - Watch portion sizes and eat small servings of a variety of foods - Choose low-calorie snacks - Eat only when you are hungry and stop when you are satisfied - Eat  slowly and try not to perform other tasks while eating - Find other activities to distract you from food, such as walking, taking up a hobby or being involved in the community - Include regular exercise in your daily routine ( minimum of 20 min of moderate-intensity exercise at least 5 days/week)  - Find a support group, if necessary, for emotional support in your weight loss effort - TALK TO YOUR DOCTOR

## 2016-01-31 NOTE — Assessment & Plan Note (Signed)
Re: taken 5000 vitamin D3 daily. We will add on a weekly prescription dose as well.

## 2016-03-05 ENCOUNTER — Encounter: Payer: Self-pay | Admitting: Family Medicine

## 2016-03-05 ENCOUNTER — Ambulatory Visit (INDEPENDENT_AMBULATORY_CARE_PROVIDER_SITE_OTHER): Payer: BLUE CROSS/BLUE SHIELD | Admitting: Family Medicine

## 2016-03-05 VITALS — BP 110/71 | HR 78 | Ht 65.75 in | Wt 252.3 lb

## 2016-03-05 DIAGNOSIS — E559 Vitamin D deficiency, unspecified: Secondary | ICD-10-CM

## 2016-03-05 DIAGNOSIS — F411 Generalized anxiety disorder: Secondary | ICD-10-CM

## 2016-03-05 DIAGNOSIS — F4323 Adjustment disorder with mixed anxiety and depressed mood: Secondary | ICD-10-CM | POA: Diagnosis not present

## 2016-03-05 DIAGNOSIS — F430 Acute stress reaction: Secondary | ICD-10-CM

## 2016-03-05 DIAGNOSIS — F439 Reaction to severe stress, unspecified: Secondary | ICD-10-CM

## 2016-03-05 DIAGNOSIS — G4700 Insomnia, unspecified: Secondary | ICD-10-CM

## 2016-03-05 NOTE — Patient Instructions (Addendum)
- cont prozac- stable and doing well  - work on eating less sat and trans fats  Walk 10 min and 34min daily  Try to track most of what you eat on my fitness pal.   F/up 2-4 wks  Goal 6-8 lbs wt loss be next time  Insomnia Insomnia is a sleep disorder that makes it difficult to fall asleep or to stay asleep. Insomnia can cause tiredness (fatigue), low energy, difficulty concentrating, mood swings, and poor performance at work or school.  There are three different ways to classify insomnia:  Difficulty falling asleep.  Difficulty staying asleep.  Waking up too early in the morning. Any type of insomnia can be long-term (chronic) or short-term (acute). Both are common. Short-term insomnia usually lasts for three months or less. Chronic insomnia occurs at least three times a week for longer than three months. CAUSES  Insomnia may be caused by another condition, situation, or substance, such as:  Anxiety.  Certain medicines.  Gastroesophageal reflux disease (GERD) or other gastrointestinal conditions.  Asthma or other breathing conditions.  Restless legs syndrome, sleep apnea, or other sleep disorders.  Chronic pain.  Menopause. This may include hot flashes.  Stroke.  Abuse of alcohol, tobacco, or illegal drugs.  Depression.  Caffeine.   Neurological disorders, such as Alzheimer disease.  An overactive thyroid (hyperthyroidism). The cause of insomnia may not be known. RISK FACTORS Risk factors for insomnia include:  Gender. Women are more commonly affected than men.  Age. Insomnia is more common as you get older.  Stress. This may involve your professional or personal life.  Income. Insomnia is more common in people with lower income.  Lack of exercise.   Irregular work schedule or night shifts.  Traveling between different time zones. SIGNS AND SYMPTOMS If you have insomnia, trouble falling asleep or trouble staying asleep is the main symptom. This  may lead to other symptoms, such as:  Feeling fatigued.  Feeling nervous about going to sleep.  Not feeling rested in the morning.  Having trouble concentrating.  Feeling irritable, anxious, or depressed. TREATMENT  Treatment for insomnia depends on the cause. If your insomnia is caused by an underlying condition, treatment will focus on addressing the condition. Treatment may also include:   Medicines to help you sleep.  Counseling or therapy.  Lifestyle adjustments. HOME CARE INSTRUCTIONS   Take medicines only as directed by your health care provider.  Keep regular sleeping and waking hours. Avoid naps.  Keep a sleep diary to help you and your health care provider figure out what could be causing your insomnia. Include:   When you sleep.  When you wake up during the night.  How well you sleep.   How rested you feel the next day.  Any side effects of medicines you are taking.  What you eat and drink.   Make your bedroom a comfortable place where it is easy to fall asleep:  Put up shades or special blackout curtains to block light from outside.  Use a white noise machine to block noise.  Keep the temperature cool.   Exercise regularly as directed by your health care provider. Avoid exercising right before bedtime.  Use relaxation techniques to manage stress. Ask your health care provider to suggest some techniques that may work well for you. These may include:  Breathing exercises.  Routines to release muscle tension.  Visualizing peaceful scenes.  Cut back on alcohol, caffeinated beverages, and cigarettes, especially close to bedtime. These can disrupt  your sleep.  Do not overeat or eat spicy foods right before bedtime. This can lead to digestive discomfort that can make it hard for you to sleep.  Limit screen use before bedtime. This includes:  Watching TV.  Using your smartphone, tablet, and computer.  Stick to a routine. This can help you  fall asleep faster. Try to do a quiet activity, brush your teeth, and go to bed at the same time each night.  Get out of bed if you are still awake after 15 minutes of trying to sleep. Keep the lights down, but try reading or doing a quiet activity. When you feel sleepy, go back to bed.  Make sure that you drive carefully. Avoid driving if you feel very sleepy.  Keep all follow-up appointments as directed by your health care provider. This is important. SEEK MEDICAL CARE IF:   You are tired throughout the day or have trouble in your daily routine due to sleepiness.  You continue to have sleep problems or your sleep problems get worse. SEEK IMMEDIATE MEDICAL CARE IF:   You have serious thoughts about hurting yourself or someone else.   This information is not intended to replace advice given to you by your health care provider. Make sure you discuss any questions you have with your health care provider.   Document Released: 06/21/2000 Document Revised: 03/15/2015 Document Reviewed: 03/25/2014 Elsevier Interactive Patient Education [employer].

## 2016-03-05 NOTE — Progress Notes (Signed)
Impression and Recommendations:    1. Anxiety, generalized   2. Acute stress disorder   3. Adjustment disorder with mixed anxiety and depressed mood   4. Morbid obesity, unspecified obesity type ([place])   5. Cannot sleep   6. Low vitamin D     - cont prozac- stable and doing well  - work on eating less sat and trans fats  Walk 10 min and 45min daily  Try to track most of what you eat on my fitness pal.   F/up 2-4 wks  Goal 6-8 lbs wt loss be next time   Patient's Medications  New Prescriptions   No medications on file  Previous Medications   ACETAMINOPHEN (TYLENOL) 500 MG TABLET    Take 1 tablet (500 mg total) by mouth every 6 (six) hours as needed.   BIOTIN 5000 MCG CAPS    Take 2 capsules by mouth at bedtime.   CHOLECALCIFEROL (D-3-5) 5000 UNITS CAPSULE    Take 5,000 Units by mouth daily.   FLUOXETINE (PROZAC) 10 MG TABLET    Take 1-2 tablets  daily   IBUPROFEN (MOTRIN IB) 200 MG TABLET    Take 3 tablets (600 mg total) by mouth every 6 (six) hours as needed.   VITAMIN B-12 (CYANOCOBALAMIN) 500 MCG TABLET    Take 500 mcg by mouth daily.   VITAMIN D, ERGOCALCIFEROL, (DRISDOL) 50000 UNITS CAPS CAPSULE    Take 1 capsule (50,000 Units total) by mouth every 7 (seven) days.  Modified Medications   No medications on file  Discontinued Medications   No medications on file    No Follow-up on file.  The patient was counseled, risk factors were discussed, anticipatory guidance given.  Gross side effects, risk and benefits, and alternatives of medications discussed with patient.  Patient is aware that all medications have potential side effects and we are unable to predict every side effect or drug-drug interaction that may occur.  Expresses verbal understanding and consents to current therapy plan and treatment regimen.  Please see AVS handed out to patient at the end of our visit for further patient instructions/ counseling done pertaining to today's office visit.      Note: This document was prepared using Dragon voice recognition software and may include unintentional dictation errors.   --------------------------------------------------------------------------------------------------------------------------------------------------------------------------------------------------------------------------------------------    Subjective:    CC:  Chief Complaint  Patient presents with  . Weight Loss    HPI: Victoria Cisneros is a 45 y.o. female who presents to [place] at [facility] today for issues as discussed below.   Walking 76min daily, eating better- less carbs, more leaner proteins.  Less red meat. Not tracking  Mood- very good on prozac 10.  Some issues with sleep still but thinks its mattress related and doggie related.     Wt Readings from Last 3 Encounters:  03/05/16 252 lb 4.8 oz (114.4 kg)  01/31/16 259 lb (117.5 kg)  01/17/16 260 lb 9.6 oz (118.2 kg)   BP Readings from Last 3 Encounters:  03/05/16 110/71  01/31/16 128/84  01/17/16 127/83   Pulse Readings from Last 3 Encounters:  03/05/16 78  01/31/16 97  01/17/16 80     Patient Active Problem List   Diagnosis Date Noted  . bmi >er 40 01/27/2016    Priority: High  . Adjustment disorder with mixed anxiety and depressed mood 01/18/2016    Priority: High  . Clinical depression 01/29/2012    Priority: High  .  Acute stress disorder 01/29/2012    Priority: High  . Anxiety, generalized 10/25/2010    Priority: High  . Prehypertension 01/31/2016  . PCOS (polycystic ovarian syndrome) 01/27/2016  . Metabolic syndrome 123456  . B12 nutritional deficiency 01/27/2016  . Health education/counseling 01/27/2016  . Esophageal reflux 01/17/2016  . Polydipsia 01/17/2016  . s/p Appy- 03/16/14 03/15/2014  . L Gastrocnemius strain 11/04/2012  . Low vitamin D 02/04/2012  . Fatigue likely due to deconditioning 01/29/2012  . Cannot sleep 01/29/2012  . Panic attack  01/29/2012  . Allergic rhinitis- spring 10/23/2010  . h/o chronic LBP- ( 3 sx) 10/23/2010    Past Medical history, Surgical history, Family history, Social history, Allergies and Medications have been entered into the medical record, reviewed and changed as needed.   Allergies:  Allergies  Allergen Reactions  . Sulfa Antibiotics Anaphylaxis    Review of Systems: No fever/ chills, night sweats, no unintended weight loss, No chest pain, or increased shortness of breath. No N/V/D.  Pertinent positives and negatives noted in HPI above    Objective:   Blood pressure 110/71, pulse 78, height 5' 5.75" (1.67 m), weight 252 lb 4.8 oz (114.4 kg), last menstrual period 02/15/2016. Body mass index is 41.03 kg/m. General: Well Developed, well nourished, appropriate for stated age.  Neuro: Alert and oriented x3, extra-ocular muscles intact, sensation grossly intact.  HEENT: Normocephalic, atraumatic, neck supple   Skin: Warm and dry, no gross rash. Cardiac: RRR, S1 S2,  no murmurs rubs or gallops.  Respiratory: ECTA B/L, Not using accessory muscles, speaking in full sentences-unlabored. Vascular:  No gross lower ext edema, cap RF less 2 sec. Psych: No HI/SI, judgement and insight good, Euthymic mood. Full Affect.

## 2016-05-01 ENCOUNTER — Ambulatory Visit: Payer: BLUE CROSS/BLUE SHIELD | Admitting: Family Medicine

## 2016-06-03 ENCOUNTER — Ambulatory Visit: Payer: BLUE CROSS/BLUE SHIELD | Admitting: Family Medicine

## 2017-03-31 ENCOUNTER — Telehealth: Payer: Self-pay | Admitting: Family Medicine

## 2017-03-31 ENCOUNTER — Other Ambulatory Visit: Payer: Self-pay | Admitting: Family Medicine

## 2017-03-31 NOTE — Telephone Encounter (Signed)
Per provider an OV required by patient to have any Rx refills, attempted to contact Pt at listed ph#, unable to leave msg Voicemail box full & no answer from patient-  Will try ph# later today. ---just an FYI---glh

## 2017-04-09 ENCOUNTER — Ambulatory Visit (INDEPENDENT_AMBULATORY_CARE_PROVIDER_SITE_OTHER): Payer: BLUE CROSS/BLUE SHIELD | Admitting: Family Medicine

## 2017-04-09 ENCOUNTER — Encounter: Payer: Self-pay | Admitting: Family Medicine

## 2017-04-09 VITALS — BP 129/83 | HR 71 | Ht 65.75 in | Wt 255.3 lb

## 2017-04-09 DIAGNOSIS — G4700 Insomnia, unspecified: Secondary | ICD-10-CM

## 2017-04-09 DIAGNOSIS — E559 Vitamin D deficiency, unspecified: Secondary | ICD-10-CM

## 2017-04-09 DIAGNOSIS — F341 Dysthymic disorder: Secondary | ICD-10-CM

## 2017-04-09 DIAGNOSIS — F430 Acute stress reaction: Secondary | ICD-10-CM

## 2017-04-09 MED ORDER — VITAMIN D (ERGOCALCIFEROL) 1.25 MG (50000 UNIT) PO CAPS: 50000.0000 [IU] | ORAL_CAPSULE | ORAL | 10 refills | Status: DC

## 2017-04-09 NOTE — Patient Instructions (Signed)

## 2017-04-09 NOTE — Progress Notes (Signed)
Impression and Recommendations:    1. Acute stress disorder   2. bmi >er 40   3. Dysthymia   4. Low vitamin D   5. Insomnia, unspecified type    - Refilled vitamin D. - Follow-up 6 months for fasting blood work.  The patient was counseled, risk factors were discussed, anticipatory guidance given.   Discontinued Medications   FLUOXETINE (PROZAC) 10 MG TABLET    Take 1-2 tablets  daily    Modified Medications   Modified Medication Previous Medication   VITAMIN D, ERGOCALCIFEROL, (DRISDOL) 50000 UNITS CAPS CAPSULE Vitamin D, Ergocalciferol, (DRISDOL) 50000 units CAPS capsule      Take 1 capsule (50,000 Units total) by mouth every 7 (seven) days.    Take 1 capsule (50,000 Units total) by mouth every 7 (seven) days.     Meds ordered this encounter  Medications  . Vitamin D, Ergocalciferol, (DRISDOL) 50000 units CAPS capsule    Sig: Take 1 capsule (50,000 Units total) by mouth every 7 (seven) days.    Dispense:  12 capsule    Refill:  10     No orders of the defined types were placed in this encounter.    Gross side effects, risk and benefits, and alternatives of medications and treatment plan in general discussed with patient.  Patient is aware that all medications have potential side effects and we are unable to predict every side effect or drug-drug interaction that may occur.   Patient will call with any questions prior to using medication if they have concerns.  Expresses verbal understanding and consents to current therapy and treatment regimen.  No barriers to understanding were identified.  Red flag symptoms and signs discussed in detail.  Patient expressed understanding regarding what to do in case of emergency\urgent symptoms  Please see AVS handed out to patient at the end of our visit for further patient instructions/ counseling done pertaining to today's office visit.   Return for f/up 71mo- bldwrk, fasting.     Note: This document was prepared using  Dragon voice recognition software and may include unintentional dictation errors.  Mellody Dance 3:43 PM --------------------------------------------------------------------------------------------------------------------------------------------------------------------------------------------------------------------------------------------    Subjective:    CC:  Chief Complaint  Patient presents with  . Follow-up    HPI: Victoria Cisneros is a 46 y.o. female who presents to [place] at [facility] today for issues as discussed below.  Haven't seen pt in past 1 yr or more.   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  Broken up with husband Mia Creek - he moved to [place] and pt living here with herself and 4 dogs.  Occ D- 44 yo is there.  Yesterday- had stepped outside her box. Mia Creek found out she was dating. He was very mad.   Emotionally-  doing well.  No meds in many, many months.  Doesn't need them-  For stress mgt.   Spends time with family.     obesity-  maintained her weight quite nicely.  Not up or down.  Currently trying to lose weight. More chicken and veggies.  Drinking more water, less soda.     Vit D insuff:  Pt took last vit D- last Thursday.    No problems updated.   Wt Readings from Last 3 Encounters:  04/09/17 255 lb 4.8 oz (115.8 kg)  03/05/16 252 lb 4.8 oz (114.4 kg)  01/31/16 259 lb (117.5 kg)   BP Readings from Last 3 Encounters:  04/09/17 129/83  03/05/16 110/71  01/31/16 128/84   Pulse Readings from Last 3 Encounters:  04/09/17 71  03/05/16 78  01/31/16 97   BMI Readings from Last 3 Encounters:  04/09/17 41.52 kg/m  03/05/16 41.03 kg/m  01/31/16 42.12 kg/m     Patient Care Team    Relationship Specialty Notifications Start End  Mellody Dance, DO PCP - General Family Medicine  01/17/16   Dian Queen, MD Consulting Physician Obstetrics and Gynecology  03/05/16      Patient Active Problem List   Diagnosis Date Noted  . bmi >er 40  01/27/2016    Priority: High  . Adjustment disorder with mixed anxiety and depressed mood 01/18/2016    Priority: High  . Clinical depression 01/29/2012    Priority: High  . Acute stress disorder 01/29/2012    Priority: High  . Anxiety, generalized 10/25/2010    Priority: High  . Prehypertension 01/31/2016  . PCOS (polycystic ovarian syndrome) 01/27/2016  . Metabolic syndrome 56/21/3086  . B12 nutritional deficiency 01/27/2016  . Health education/counseling 01/27/2016  . Esophageal reflux 01/17/2016  . Polydipsia 01/17/2016  . s/p Appy- 03/16/14 03/15/2014  . L Gastrocnemius strain 11/04/2012  . Low vitamin D 02/04/2012  . Fatigue likely due to deconditioning 01/29/2012  . Cannot sleep 01/29/2012  . Panic attack 01/29/2012  . Allergic rhinitis- spring 10/23/2010  . h/o chronic LBP- ( 3 sx) 10/23/2010    Past Medical history, Surgical history, Family history, Social history, Allergies and Medications have been entered into the medical record, reviewed and changed as needed.    Current Meds  Medication Sig  . acetaminophen (TYLENOL) 500 MG tablet Take 1 tablet (500 mg total) by mouth every 6 (six) hours as needed.  . Biotin 5000 MCG CAPS Take 2 capsules by mouth at bedtime.  . Cholecalciferol (D-3-5) 5000 units capsule Take 5,000 Units by mouth daily.  Marland Kitchen ibuprofen (MOTRIN IB) 200 MG tablet Take 3 tablets (600 mg total) by mouth every 6 (six) hours as needed.  . vitamin B-12 (CYANOCOBALAMIN) 500 MCG tablet Take 500 mcg by mouth daily.  . Vitamin D, Ergocalciferol, (DRISDOL) 50000 units CAPS capsule Take 1 capsule (50,000 Units total) by mouth every 7 (seven) days.  . [DISCONTINUED] Vitamin D, Ergocalciferol, (DRISDOL) 50000 units CAPS capsule Take 1 capsule (50,000 Units total) by mouth every 7 (seven) days.    Allergies:  Allergies  Allergen Reactions  . Sulfa Antibiotics Anaphylaxis     Review of Systems: General:   Denies fever, chills, unexplained weight loss.    Optho/Auditory:   Denies visual changes, blurred vision/LOV Respiratory:   Denies wheeze, DOE more than baseline levels.  Cardiovascular:   Denies chest pain, palpitations, new onset peripheral edema  Gastrointestinal:   Denies nausea, vomiting, diarrhea, abd pain.  Genitourinary: Denies dysuria, freq/ urgency, flank pain or discharge from genitals.  Endocrine:     Denies hot or cold intolerance, polyuria, polydipsia. Musculoskeletal:   Denies unexplained myalgias, joint swelling, unexplained arthralgias, gait problems.  Skin:  Denies new onset rash, suspicious lesions Neurological:     Denies dizziness, unexplained weakness, numbness  Psychiatric/Behavioral:   Denies mood changes, suicidal or homicidal ideations, hallucinations    Objective:   Blood pressure 129/83, pulse 71, height 5' 5.75" (1.67 m), weight 255 lb 4.8 oz (115.8 kg), last menstrual period 03/31/2017. Body mass index is 41.52 kg/m. General:  Well Developed, well nourished, appropriate for stated age.  Neuro:  Alert and oriented,  extra-ocular muscles intact  HEENT:  Normocephalic, atraumatic, neck  supple, no carotid bruits appreciated  Skin:  no gross rash, warm, pink. Cardiac:  RRR, S1 S2 Respiratory:  ECTA B/L and A/P, Not using accessory muscles, speaking in full sentences- unlabored. Vascular:  Ext warm, no cyanosis apprec.; cap RF less 2 sec. Psych:  No HI/SI, judgement and insight good, Euthymic mood. Full Affect.

## 2017-04-21 ENCOUNTER — Encounter: Payer: Self-pay | Admitting: Family Medicine

## 2017-05-27 ENCOUNTER — Telehealth: Payer: Self-pay

## 2017-05-27 NOTE — Telephone Encounter (Signed)
Called patient to notify that we have gotten her last pap report from 08/16/2013 and that she needs repeat every 3 years per Dr.Opalski.  Patient would have been due since 08/16/2016 and needs to make an appointment for pap in the near future. Patient states that she will call and set up appointment with her GYN. >mp

## 2017-08-13 ENCOUNTER — Ambulatory Visit (INDEPENDENT_AMBULATORY_CARE_PROVIDER_SITE_OTHER): Payer: Self-pay | Admitting: Family Medicine

## 2017-08-13 ENCOUNTER — Encounter: Payer: Self-pay | Admitting: Family Medicine

## 2017-08-13 VITALS — BP 121/85 | HR 85 | Temp 98.7°F | Ht 65.75 in | Wt 263.6 lb

## 2017-08-13 DIAGNOSIS — R059 Cough, unspecified: Secondary | ICD-10-CM

## 2017-08-13 DIAGNOSIS — J111 Influenza due to unidentified influenza virus with other respiratory manifestations: Secondary | ICD-10-CM

## 2017-08-13 DIAGNOSIS — R0602 Shortness of breath: Secondary | ICD-10-CM

## 2017-08-13 DIAGNOSIS — R52 Pain, unspecified: Secondary | ICD-10-CM

## 2017-08-13 DIAGNOSIS — R05 Cough: Secondary | ICD-10-CM

## 2017-08-13 LAB — POCT INFLUENZA A/B
INFLUENZA B, POC: POSITIVE — AB
Influenza A, POC: NEGATIVE

## 2017-08-13 MED ORDER — HYDROCOD POLST-CPM POLST ER 10-8 MG/5ML PO SUER: 5.0000 mL | Freq: Two times a day (BID) | ORAL | 0 refills | Status: DC | PRN

## 2017-08-13 MED ORDER — ALBUTEROL SULFATE HFA 108 (90 BASE) MCG/ACT IN AERS: 1.0000 | INHALATION_SPRAY | RESPIRATORY_TRACT | 2 refills | Status: DC | PRN

## 2017-08-13 NOTE — Progress Notes (Signed)
Acute Care Office visit  Assessment and plan:  1. Body aches   2. Influenza   3. Cough   4. SOB (shortness of breath)    1. Body aches- push fluids and get plenty of rest.  2. Influenza- Push fluids and get plenty of rest. Take OTC medications for symptom relief as-needed. Take cough drops or throat lozenges at home.   -Be fever free for 24 hours before returning to work. Instructed pt to not take antipyretics before checking her fever.  3. Cough- start meds  4. SOB-start meds -Offered pt take steroids to which she declined.  Follow up in office as-needed or if symptoms worsen.  - Viral vs Allergic vs Bacterial causes for pt's symptoms reveiwed.    - Supportive care and various OTC medications discussed in addition to any prescribed. - Call or RTC if new symptoms, or if no improvement or worse over next several days.   - Will consider ABX if sx continue past 10 days and worsening if not already given.    Meds ordered this encounter  Medications  . chlorpheniramine-HYDROcodone (TUSSIONEX) 10-8 MG/5ML SUER    Sig: Take 5 mLs by mouth every 12 (twelve) hours as needed for cough (cough, will cause drowsiness.).    Dispense:  200 mL    Refill:  0  . albuterol (PROVENTIL HFA;VENTOLIN HFA) 108 (90 Base) MCG/ACT inhaler    Sig: Inhale 1-2 puffs into the lungs every 4 (four) hours as needed for wheezing or shortness of breath.    Dispense:  1 Inhaler    Refill:  2    Orders Placed This Encounter  Procedures  . POCT Influenza A/B    Gross side effects, risk and benefits, and alternatives of medications discussed with patient.  Patient is aware that all medications have potential side effects and we are unable to predict every sideeffect or drug-drug interaction that may occur.  Expresses verbal understanding and consents to current therapy plan and treatment regiment.   Education and routine counseling performed. Handouts provided.  Anticipatory guidance and routine  counseling done re: condition, txmnt options and need for follow up. All questions of patient's were answered.  Return if symptoms worsen or fail to improve, for f/up chronic care issues as previously discussed.  Please see AVS handed out to patient at the end of our visit for additional patient instructions/ counseling done pertaining to today's office visit.  Note: This document was partially repared using Dragon voice recognition software and may include unintentional dictation errors.  This document serves as a record of services personally performed by Mellody Dance, DO. It was created on her behalf by Mayer Masker, a trained medical scribe. The creation of this record is based on the scribe's personal observations and the provider's statements to them.   I have reviewed the above medical documentation for accuracy and completeness and I concur.  Mellody Dance 09/10/17 3:07 PM   Subjective:    Chief Complaint  Patient presents with  . Cough    productive cough x 3 days     HPI:  Pt presents with Sx for 3 days   C/o: cough, SOB, subjective fever for 1-2 days, and wheezing.  Denies: aches, HA,     For symptoms patient has tried:  Dayquil, nyquil  Overall getting:   same  She has a positive contact with a coworker's daughter who has the flu.    Patient Care Team    Relationship Specialty Notifications Start  End  Mellody Dance, DO PCP - General Family Medicine  01/17/16   Dian Queen, MD Consulting Physician Obstetrics and Gynecology  03/05/16     Past medical history, Surgical history, Family history reviewed and noted below, Social history, Allergies, and Medications have been entered into the medical record, reviewed and changed as needed.   Allergies  Allergen Reactions  . Sulfa Antibiotics Anaphylaxis    Review of Systems: - see above HPI for pertinent positives General:   No F/C, wt loss Pulm:   No DIB, pleuritic chest pain Card:  No CP,  palpitations Abd:  No n/v/d or pain Ext:  No inc edema from baseline   Objective:   Blood pressure 121/85, pulse 85, temperature 98.7 F (37.1 C), height 5' 5.75" (1.67 m), weight 263 lb 9.6 oz (119.6 kg), last menstrual period 08/06/2017, SpO2 98 %. Body mass index is 42.87 kg/m. General: Well Developed, well nourished, appropriate for stated age.  Neuro: Alert and oriented x3, extra-ocular muscles intact, sensation grossly intact.  HEENT: Normocephalic, atraumatic, pupils equal round reactive to light, neck supple, no masses, no painful lymphadenopathy, TM's intact B/L, no acute findings. Nares- patent, clear d/c, Erythema in posterior oropharynx, No TTP sinuses Skin: Warm and dry, no gross rash. Cardiac: RRR, S1 S2,  no murmurs rubs or gallops.  Respiratory: ECTA B/L and A/P, Not using accessory muscles, speaking in full sentences- unlabored. Vascular:  No gross lower ext edema, cap RF less 2 sec. Psych: No HI/SI, judgement and insight good, Euthymic mood. Full Affect.

## 2017-08-13 NOTE — Patient Instructions (Addendum)
Out of work tomorrow and possibly Friday.  Must be fever-free 24 hours before can RTW.     Influenza, Adult Influenza, more commonly known as "the flu," is a viral infection that primarily affects the respiratory tract. The respiratory tract includes organs that help you breathe, such as the lungs, nose, and throat. The flu causes many common cold symptoms, as well as a high fever and body aches. The flu spreads easily from person to person (is contagious). Getting a flu shot (influenza vaccination) every year is the best way to prevent influenza. What are the causes? Influenza is caused by a virus. You can catch the virus by:  Breathing in droplets from an infected person's cough or sneeze.  Touching something that was recently contaminated with the virus and then touching your mouth, nose, or eyes.  What increases the risk? The following factors may make you more likely to get the flu:  Not cleaning your hands frequently with soap and water or alcohol-based hand sanitizer.  Having close contact with many people during cold and flu season.  Touching your mouth, eyes, or nose without washing or sanitizing your hands first.  Not drinking enough fluids or not eating a healthy diet.  Not getting enough sleep or exercise.  Being under a high amount of stress.  Not getting a yearly (annual) flu shot.  You may be at a higher risk of complications from the flu, such as a severe lung infection (pneumonia), if you:  Are over the age of 13.  Are pregnant.  Have a weakened disease-fighting system (immune system). You may have a weakened immune system if you: ? Have HIV or AIDS. ? Are undergoing chemotherapy. ? Aretaking medicines that reduce the activity of (suppress) the immune system.  Have a long-term (chronic) illness, such as heart disease, kidney disease, diabetes, or lung disease.  Have a liver disorder.  Are obese.  Have anemia.  What are the signs or  symptoms? Symptoms of this condition typically last 4-10 days and may include:  Fever.  Chills.  Headache, body aches, or muscle aches.  Sore throat.  Cough.  Runny or congested nose.  Chest discomfort and cough.  Poor appetite.  Weakness or tiredness (fatigue).  Dizziness.  Nausea or vomiting.  How is this diagnosed? This condition may be diagnosed based on your medical history and a physical exam. Your health care provider may do a nose or throat swab test to confirm the diagnosis. How is this treated? If influenza is detected early, you can be treated with antiviral medicine that can reduce the length of your illness and the severity of your symptoms. This medicine may be given by mouth (orally) or through an IV tube that is inserted in one of your veins. The goal of treatment is to relieve symptoms by taking care of yourself at home. This may include taking over-the-counter medicines, drinking plenty of fluids, and adding humidity to the air in your home. In some cases, influenza goes away on its own. Severe influenza or complications from influenza may be treated in a hospital. Follow these instructions at home:  Take over-the-counter and prescription medicines only as told by your health care provider.  Use a cool mist humidifier to add humidity to the air in your home. This can make breathing easier.  Rest as needed.  Drink enough fluid to keep your urine clear or pale yellow.  Cover your mouth and nose when you cough or sneeze.  Wash your hands with  soap and water often, especially after you cough or sneeze. If soap and water are not available, use hand sanitizer.  Stay home from work or school as told by your health care provider. Unless you are visiting your health care provider, try to avoid leaving home until your fever has been gone for 24 hours without the use of medicine.  Keep all follow-up visits as told by your health care provider. This is  important. How is this prevented?  Getting an annual flu shot is the best way to avoid getting the flu. You may get the flu shot in late summer, fall, or winter. Ask your health care provider when you should get your flu shot.  Wash your hands often or use hand sanitizer often.  Avoid contact with people who are sick during cold and flu season.  Eat a healthy diet, drink plenty of fluids, get enough sleep, and exercise regularly. Contact a health care provider if:  You develop new symptoms.  You have: ? Chest pain. ? Diarrhea. ? A fever.  Your cough gets worse.  You produce more mucus.  You feel nauseous or you vomit. Get help right away if:  You develop shortness of breath or difficulty breathing.  Your skin or nails turn a bluish color.  You have severe pain or stiffness in your neck.  You develop a sudden headache or sudden pain in your face or ear.  You cannot stop vomiting. This information is not intended to replace advice given to you by your health care provider. Make sure you discuss any questions you have with your health care provider. Document Released: 06/21/2000 Document Revised: 11/30/2015 Document Reviewed: 04/18/2015 Elsevier Interactive Patient Education  2017 [employer].

## 2017-10-08 ENCOUNTER — Ambulatory Visit: Payer: Self-pay | Admitting: Family Medicine

## 2017-10-29 ENCOUNTER — Ambulatory Visit: Payer: Self-pay | Admitting: Family Medicine

## 2017-12-03 ENCOUNTER — Encounter: Payer: Self-pay | Admitting: Family Medicine

## 2017-12-03 ENCOUNTER — Other Ambulatory Visit: Payer: Self-pay

## 2017-12-03 ENCOUNTER — Ambulatory Visit (INDEPENDENT_AMBULATORY_CARE_PROVIDER_SITE_OTHER): Payer: Self-pay | Admitting: Family Medicine

## 2017-12-03 ENCOUNTER — Ambulatory Visit: Payer: Self-pay

## 2017-12-03 ENCOUNTER — Ambulatory Visit (INDEPENDENT_AMBULATORY_CARE_PROVIDER_SITE_OTHER): Payer: Self-pay

## 2017-12-03 VITALS — BP 131/80 | HR 80 | Ht 65.75 in | Wt 265.8 lb

## 2017-12-03 DIAGNOSIS — M25561 Pain in right knee: Secondary | ICD-10-CM

## 2017-12-03 DIAGNOSIS — Z9181 History of falling: Secondary | ICD-10-CM

## 2017-12-03 DIAGNOSIS — S80811A Abrasion, right lower leg, initial encounter: Secondary | ICD-10-CM

## 2017-12-03 DIAGNOSIS — S8011XA Contusion of right lower leg, initial encounter: Secondary | ICD-10-CM

## 2017-12-03 DIAGNOSIS — W19XXXA Unspecified fall, initial encounter: Secondary | ICD-10-CM

## 2017-12-03 NOTE — Progress Notes (Signed)
Pt here for an acute care OV today   Impression and Recommendations:    1. Status post fall   2. Acute pain of right knee   3. Abrasion of right lower extremity, initial encounter   4. Multiple leg contusions, right, initial encounter     1. R knee pain s/p fall/abrasion of RLE/leg contusions -Xray of R knee ordered.  Independently reviewed Xray and interpreted by me. No acute abnormalities found. Will alert pt if there are any abnormalities discovered by radiologist.  -Regularly apply ice to the areas (15-20 minutes at a time) that are painful, especially knee, shoulder, and elbow.  -Continue ibuprofen for pain. -avoid any aggravating activities.    -Check fasting labs in near future. Stressed the importance of coming to the clinic for routine chronic care.   No orders of the defined types were placed in this encounter.   Orders Placed This Encounter  Procedures  . DG Knee 4 Views W/Patella Right     Education and routine counseling performed. Handouts provided  Gross side effects, risk and benefits, and alternatives of medications and treatment plan in general discussed with patient.  Patient is aware that all medications have potential side effects and we are unable to predict every side effect or drug-drug interaction that may occur.   Patient will call with any questions prior to using medication if they have concerns.  Expresses verbal understanding and consents to current therapy and treatment regimen.  No barriers to understanding were identified.  Red flag symptoms and signs discussed in detail.  Patient expressed understanding regarding what to do in case of emergency\urgent symptoms   Please see AVS handed out to patient at the end of our visit for further patient instructions/ counseling done pertaining to today's office visit.   Return if symptoms worsen or fail to improve, for Chronic OV w me near future & FBW 2-3d prior.     Note: This document was  prepared occasionally using Dragon voice recognition software and may include unintentional dictation errors in addition to a scribe.   This document serves as a record of services personally performed by Mellody Dance, DO. It was created on her behalf by Mayer Masker, a trained medical scribe. The creation of this record is based on the scribe's personal observations and the provider's statements to them.   I have reviewed the above medical documentation for accuracy and completeness and I concur.  Mellody Dance 12/11/17 11:00 AM  --------------------------------------------------------------------------------------------------------------------------------------------------------------------------------------------------------------------------------------------    Subjective:    CC:  Chief Complaint  Patient presents with  . Fall    HPI: Victoria Cisneros is a 47 y.o. female who presents to [place] at [facility] today for issues as discussed below.  She complains of R knee pain s/p a fall that occurred last night. She states she was walking on her wooden deck when her R leg fell through the deck. She caught herself with her hands down and her R leg did not touch the ground. She states some of the boards on her wooden deck were "bad" and broke through when she stepped on it.  She has associated warmth in her R knee and R arm pain. The pain is worse when she walks and when she bends her knee. Getting up and setting down also make her pain worse. She has taken ibuprofen for pain with relief.   She denies ankle pain and hip pain.   No problems updated.  Wt Readings from Last 3 Encounters:  12/03/17 265 lb 12.8 oz (120.6 kg)  08/13/17 263 lb 9.6 oz (119.6 kg)  04/09/17 255 lb 4.8 oz (115.8 kg)   BP Readings from Last 3 Encounters:  12/03/17 131/80  08/13/17 121/85  04/09/17 129/83   BMI Readings from Last 3 Encounters:  12/03/17 43.23 kg/m  08/13/17  42.87 kg/m  04/09/17 41.52 kg/m     Patient Care Team    Relationship Specialty Notifications Start End  Mellody Dance, DO PCP - General Family Medicine  01/17/16   Dian Queen, MD Consulting Physician Obstetrics and Gynecology  03/05/16      Patient Active Problem List   Diagnosis Date Noted  . bmi >er 40 01/27/2016    Priority: High  . Adjustment disorder with mixed anxiety and depressed mood 01/18/2016    Priority: High  . Clinical depression 01/29/2012    Priority: High  . Acute stress disorder 01/29/2012    Priority: High  . Anxiety, generalized 10/25/2010    Priority: High  . Prehypertension 01/31/2016  . PCOS (polycystic ovarian syndrome) 01/27/2016  . Metabolic syndrome 40/98/1191  . B12 nutritional deficiency 01/27/2016  . Health education/counseling 01/27/2016  . Esophageal reflux 01/17/2016  . Polydipsia 01/17/2016  . s/p Appy- 03/16/14 03/15/2014  . L Gastrocnemius strain 11/04/2012  . Low vitamin D 02/04/2012  . Fatigue likely due to deconditioning 01/29/2012  . Cannot sleep 01/29/2012  . Panic attack 01/29/2012  . Allergic rhinitis- spring 10/23/2010  . h/o chronic LBP- ( 3 sx) 10/23/2010    Past Medical history, Surgical history, Family history, Social history, Allergies and Medications have been entered into the medical record, reviewed and changed as needed.    Current Meds  Medication Sig  . acetaminophen (TYLENOL) 500 MG tablet Take 1 tablet (500 mg total) by mouth every 6 (six) hours as needed.  . Biotin 5000 MCG CAPS Take 2 capsules by mouth at bedtime.  . Cholecalciferol (D-3-5) 5000 units capsule Take 5,000 Units by mouth daily.  Marland Kitchen ibuprofen (MOTRIN IB) 200 MG tablet Take 3 tablets (600 mg total) by mouth every 6 (six) hours as needed.  . vitamin B-12 (CYANOCOBALAMIN) 500 MCG tablet Take 500 mcg by mouth daily.  . Vitamin D, Ergocalciferol, (DRISDOL) 50000 units CAPS capsule Take 1 capsule (50,000 Units total) by mouth every 7 (seven)  days.    Allergies:  Allergies  Allergen Reactions  . Sulfa Antibiotics Anaphylaxis     Review of Systems: General:   Denies fever, chills, unexplained weight loss.  Optho/Auditory:   Denies visual changes, blurred vision/LOV Respiratory:   Denies wheeze, DOE more than baseline levels.  Cardiovascular:   Denies chest pain, palpitations, new onset peripheral edema  Gastrointestinal:   Denies nausea, vomiting, diarrhea, abd pain.  Genitourinary: Denies dysuria, freq/ urgency, flank pain or discharge from genitals.  Endocrine:     Denies hot or cold intolerance, polyuria, polydipsia. Musculoskeletal:   Denies unexplained myalgias, joint swelling, unexplained arthralgias, gait problems.  Skin:  Denies new onset rash, suspicious lesions Neurological:     Denies dizziness, unexplained weakness, numbness  Psychiatric/Behavioral:   Denies mood changes, suicidal or homicidal ideations, hallucinations    Objective:   Blood pressure 131/80, pulse 80, height 5' 5.75" (1.67 m), weight 265 lb 12.8 oz (120.6 kg), last menstrual period 11/26/2017, SpO2 98 %. Body mass index is 43.23 kg/m. General:  Well Developed, well nourished, appropriate for stated age.  Neuro:  Alert and oriented,  extra-ocular muscles  intact  HEENT:  Normocephalic, atraumatic, neck supple Skin:  no gross rash, warm, pink. Multiple superficial lacerations to R distal anterior thigh and proximal R lower leg. Significant abrasion to lateral aspect R lower leg. Contusion present R lateral aspect of thigh. Cardiac:  RRR, S1 S2 Respiratory:  ECTA B/L and A/P, Not using accessory muscles, speaking in full sentences- unlabored. Vascular:  Ext warm, no cyanosis apprec.; cap RF less 2 sec. Psych:  No HI/SI, judgement and insight good, Euthymic mood. Full Affect. Knee  R knee: no erythema. Effusion of R knee present with increased warmth to joint space. No obvious bony abnormalities. Tender to Palpation with mild increased warmth,  + joint line tenderness, + patellar tenderness, no condyle tenderness. ROM diminished in flexion and extension and lower leg rotation due to pain. Ligaments with solid endpoint including ACL, LCL, MCL.  Painful patellar compression.

## 2017-12-03 NOTE — Patient Instructions (Addendum)
You will need a chronic follow-up visit in 1 month or so with a full set of fasting blood work to be drawn 2-3 days prior.  Melissa my CMA will put in those orders for you and we will go over them at the office visit you schedule after labs.  This is extremely important to your preventative health maintenance.    Ice 15-20 minutes 3-4 times a day or more over all areas of pain and/or contusion.

## 2017-12-15 ENCOUNTER — Other Ambulatory Visit: Payer: Self-pay

## 2017-12-15 DIAGNOSIS — E559 Vitamin D deficiency, unspecified: Secondary | ICD-10-CM

## 2017-12-15 DIAGNOSIS — R030 Elevated blood-pressure reading, without diagnosis of hypertension: Secondary | ICD-10-CM

## 2017-12-15 DIAGNOSIS — E538 Deficiency of other specified B group vitamins: Secondary | ICD-10-CM

## 2017-12-15 DIAGNOSIS — R5383 Other fatigue: Secondary | ICD-10-CM

## 2017-12-15 DIAGNOSIS — E8881 Metabolic syndrome: Secondary | ICD-10-CM

## 2018-01-29 ENCOUNTER — Other Ambulatory Visit: Payer: Self-pay

## 2018-02-03 ENCOUNTER — Ambulatory Visit: Payer: Self-pay | Admitting: Family Medicine

## 2018-03-24 DIAGNOSIS — L02212 Cutaneous abscess of back [any part, except buttock]: Secondary | ICD-10-CM | POA: Diagnosis not present

## 2018-04-19 DIAGNOSIS — J029 Acute pharyngitis, unspecified: Secondary | ICD-10-CM | POA: Diagnosis not present

## 2018-05-12 DIAGNOSIS — Z719 Counseling, unspecified: Secondary | ICD-10-CM | POA: Diagnosis not present

## 2018-07-28 ENCOUNTER — Ambulatory Visit (INDEPENDENT_AMBULATORY_CARE_PROVIDER_SITE_OTHER): Payer: 59

## 2018-07-28 ENCOUNTER — Ambulatory Visit (INDEPENDENT_AMBULATORY_CARE_PROVIDER_SITE_OTHER): Payer: 59 | Admitting: Family Medicine

## 2018-07-28 ENCOUNTER — Encounter (INDEPENDENT_AMBULATORY_CARE_PROVIDER_SITE_OTHER): Payer: Self-pay | Admitting: Family Medicine

## 2018-07-28 DIAGNOSIS — M25512 Pain in left shoulder: Secondary | ICD-10-CM | POA: Diagnosis not present

## 2018-07-28 MED ORDER — BACLOFEN 10 MG PO TABS: 10.0000 mg | ORAL_TABLET | Freq: Every evening | ORAL | 1 refills | Status: AC | PRN

## 2018-07-28 MED ORDER — METHYLPREDNISOLONE 4 MG PO TBPK: ORAL_TABLET | ORAL | 0 refills | Status: AC

## 2018-07-28 NOTE — Progress Notes (Signed)
Office Visit Note   Patient: Victoria Cisneros           Date of Birth: September 26, 1970           MRN: [id number] Visit Date: 07/28/2018 Requested by: Mellody Dance, DO [place], [place] [id number] PCP: Mellody Dance, DO  Subjective: Chief Complaint  Patient presents with  . Left Shoulder - Pain    Pain x 2 months. Some days are worse than others. Intermittent decreased ROM due to pain. Today, pain is "like a dull toothache." Disturbs sleep.    HPI: She is a 48 year old right-hand-dominant female with left shoulder pain.  Symptoms started about 2 months ago, and no injury.  She did not have pain at first, only some stiffness in the shoulder.  In the last couple weeks the pain has gotten worse to the point that she feels an aching pain especially at night when trying to sleep.  Her range of motion has gotten much worse.  She is never had problems with her shoulder before.  She is not diabetic.  She has a family history of gout in her father but she is never had hit herself.  She is otherwise in good health.              ROS: Otherwise noncontributory  Objective: Vital Signs: There were no vitals taken for this visit.  Physical Exam:  Left shoulder: Abduction is 90 degrees, external rotation 70 degrees and internal rotation 45.  Right shoulder range of motion is 110, 90, and 75 respectively.  Left shoulder hurts to palpation of the subacromial space.  No tenderness at the [facility] joint or the long head biceps tendon.  Isometric rotator cuff strength is 5/5 throughout with no pain.  Imaging: X-rays left shoulder: Mild AC joint arthritis, slightly downward and hooked to the acromion.  Glenohumeral joint is unremarkable, no soft tissue calcifications.  No sign of fracture or neoplasm.  Assessment & Plan: 1.  Left shoulder pain, probably due to subacromial impingement with bursitis and subsequent adhesive capsulitis -Medrol Dosepak, home stretching exercises.  If not improving by  next week could contemplate physical therapy and possibly a subacromial injection.  MRI if she fails to improve.   Follow-Up Instructions: No follow-ups on file.      Procedures: No procedures performed  No notes on file    PMFS History: Patient Active Problem List   Diagnosis Date Noted  . Prehypertension 01/31/2016  . bmi >er 40 01/27/2016  . PCOS (polycystic ovarian syndrome) 01/27/2016  . Metabolic syndrome 46/50/3546  . B12 nutritional deficiency 01/27/2016  . Health education/counseling 01/27/2016  . Adjustment disorder with mixed anxiety and depressed mood 01/18/2016  . Esophageal reflux 01/17/2016  . Polydipsia 01/17/2016  . s/p Appy- 03/16/14 03/15/2014  . L Gastrocnemius strain 11/04/2012  . Low vitamin D 02/04/2012  . Clinical depression 01/29/2012  . Fatigue likely due to deconditioning 01/29/2012  . Cannot sleep 01/29/2012  . Panic attack 01/29/2012  . Acute stress disorder 01/29/2012  . Anxiety, generalized 10/25/2010  . Allergic rhinitis- spring 10/23/2010  . h/o chronic LBP- ( 3 sx) 10/23/2010   Past Medical History:  Diagnosis Date  . Allergy   . Anxiety   . Anxiety, generalized 10/25/2010   Overview:  Generalized Anxiety Disorder   . Cannot sleep 01/29/2012  . Clinical depression 01/29/2012  . Depression   . Fatigue likely due to deconditioning 01/29/2012  . h/o chronic LBP (low back pain) 10/23/2010  Overview:  Lower Back Pain   . Metabolic syndrome 9/62/2297  . Morbid obesity ([place]) 01/27/2016    Family History  Problem Relation Age of Onset  . Diabetes Mother   . Hyperlipidemia Mother   . Hypertension Mother   . Cancer Mother        breast  . Stroke Mother   . Hypertension Father   . Hyperlipidemia Father   . Diabetes Father   . Heart attack Father   . Cancer Father        lung  . Sudden death Neg Hx     Past Surgical History:  Procedure Laterality Date  . BACK SURGERY    . LAPAROSCOPIC APPENDECTOMY N/A 03/15/2014   Procedure:  APPENDECTOMY LAPAROSCOPIC;  Surgeon: Armandina Gemma, MD;  Location: [facility];  Service: General;  Laterality: N/A;  . LAPAROSCOPY Right 12/03/2013   Procedure: DIAGNOSTIC LAPAROSCOPY OPERATIVE WITH RIGHT OVARIAN CYSTECTOMY;  Surgeon: Cyril Mourning, MD;  Location: [facility];  Service: Gynecology;  Laterality: Right;  . OVARIAN CYST REMOVAL    . PILONIDAL CYST EXCISION     Social History   Occupational History  . Not on file  Tobacco Use  . Smoking status: Never Smoker  . Smokeless tobacco: Never Used  Substance and Sexual Activity  . Alcohol use: No  . Drug use: No  . Sexual activity: Yes    Birth control/protection: None

## 2018-07-29 ENCOUNTER — Ambulatory Visit (INDEPENDENT_AMBULATORY_CARE_PROVIDER_SITE_OTHER): Payer: Self-pay | Admitting: Family Medicine

## 2019-08-20 IMAGING — DX DG KNEE COMPLETE 4+V*R*
5 series · 5 of 5 positions shown · non-contrast
Comparison: None.

CLINICAL DATA: 46-year-old female status post fall last night with
right knee abrasions, pain and swelling.

EXAM:
RIGHT KNEE - COMPLETE 4+ VIEW

[knee ap]
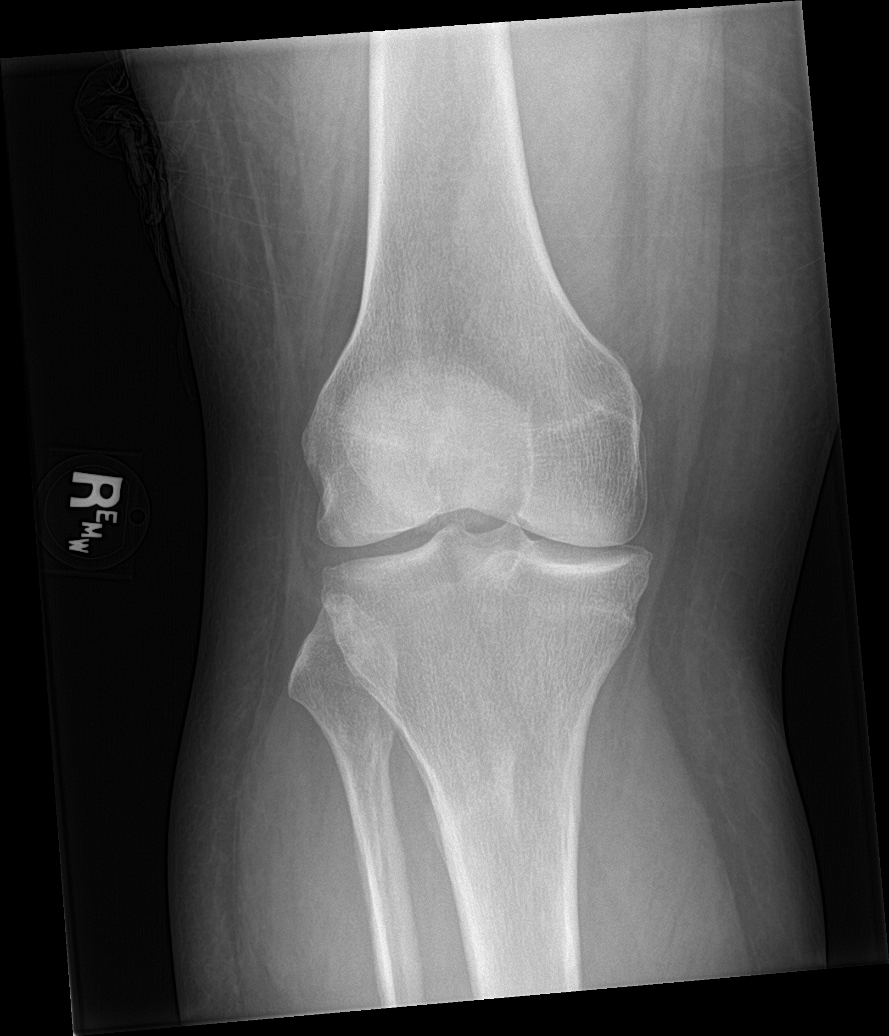

[tunnel]
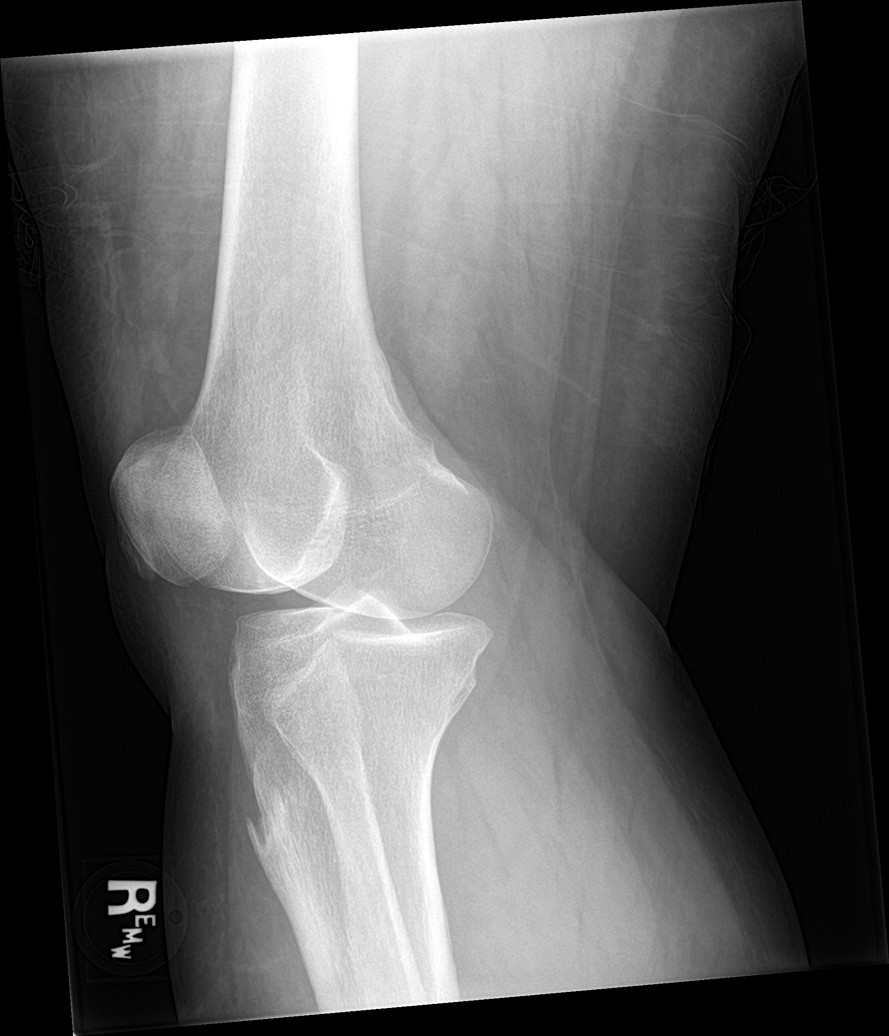

[knee lat]
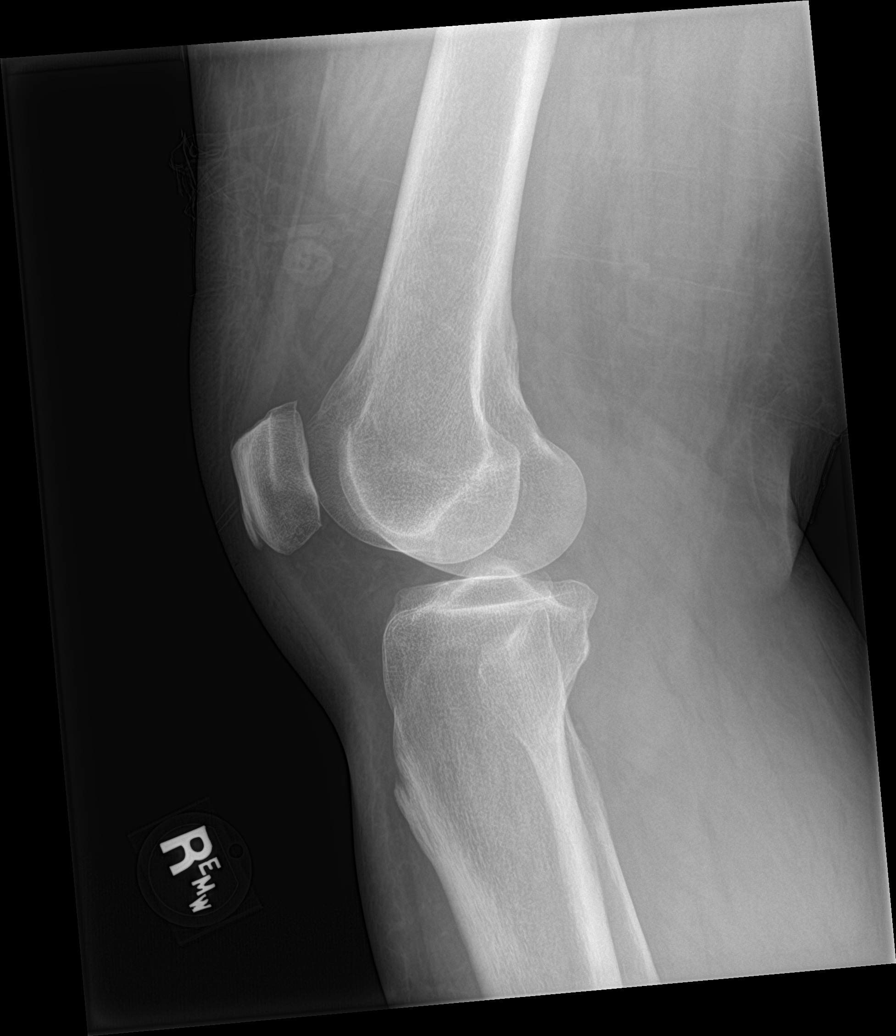

[knee sunrise]
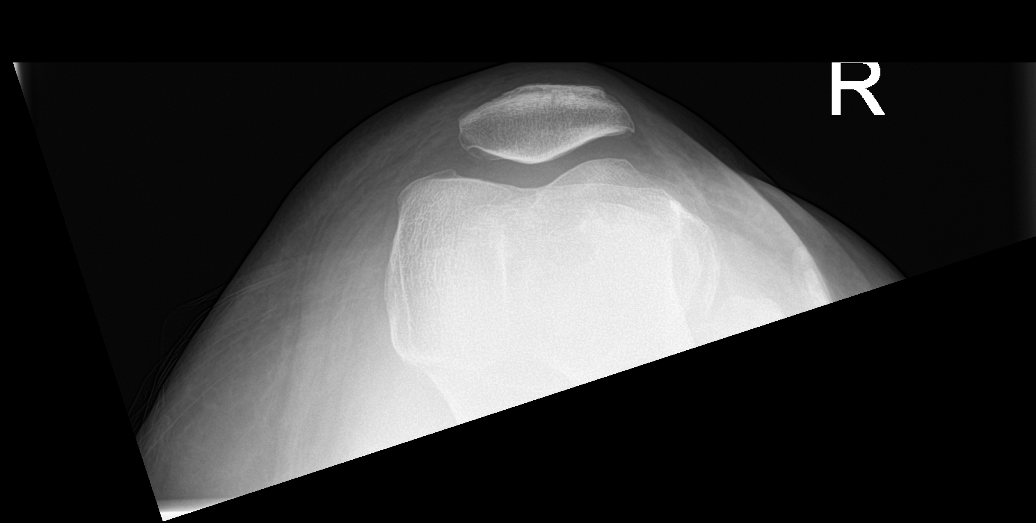

[knee obl]
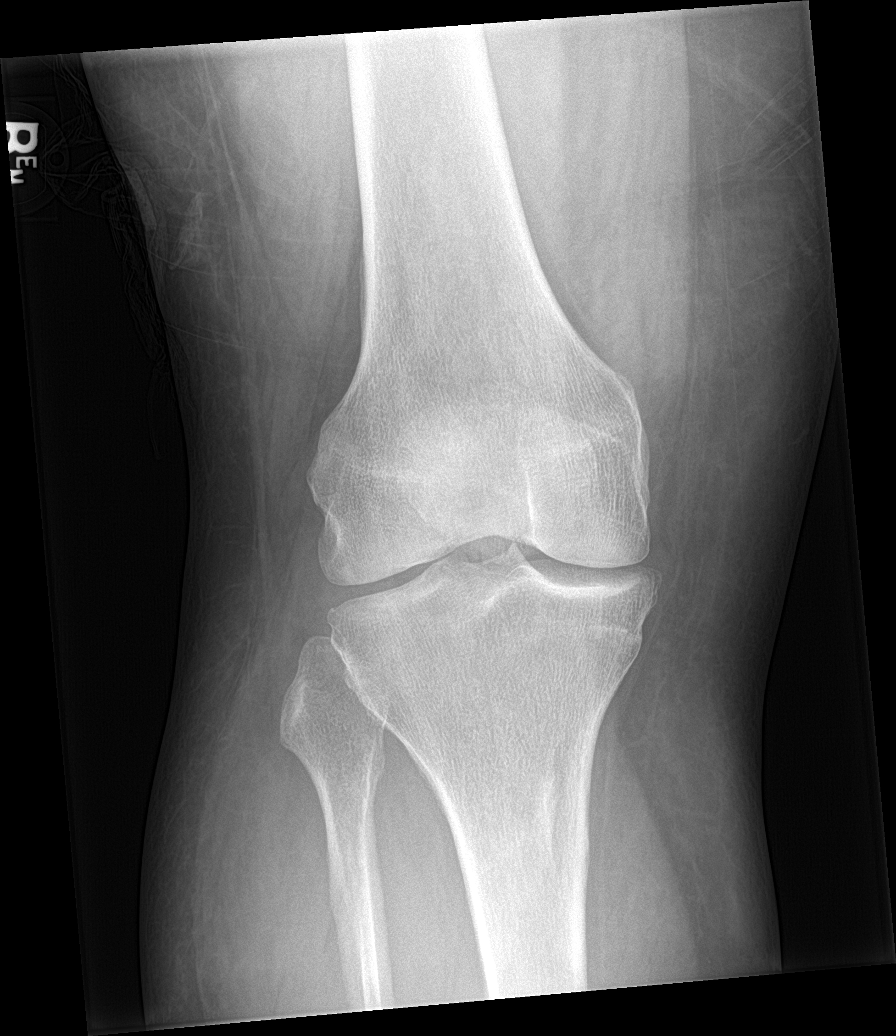

[5 of 5 positions shown; findings below may reference images not displayed]

FINDINGS: Bone mineralization is within normal limits. No evidence of
fracture, dislocation, or joint effusion. Mild medial compartment
joint space loss. Mild tricompartmental degenerative spurring. No
radiopaque foreign body identified. No discrete soft tissue injury
identified.
IMPRESSION: No acute radiographic abnormality about the right knee.
